# Patient Record
Sex: Female | Born: 1970 | Race: Black or African American | Hispanic: No | Smoking: Never smoker
Health system: Southern US, Community
[De-identification: ages and names within clinical notes are randomized; demographics above are authoritative.]

## PROBLEM LIST (undated history)

## (undated) DIAGNOSIS — L309 Dermatitis, unspecified: Secondary | ICD-10-CM

## (undated) DIAGNOSIS — G43909 Migraine, unspecified, not intractable, without status migrainosus: Secondary | ICD-10-CM

## (undated) DIAGNOSIS — M722 Plantar fascial fibromatosis: Secondary | ICD-10-CM

## (undated) HISTORY — DX: Dermatitis, unspecified: L30.9

## (undated) HISTORY — DX: Plantar fascial fibromatosis: M72.2

## (undated) HISTORY — DX: Migraine, unspecified, not intractable, without status migrainosus: G43.909

## (undated) HISTORY — PX: UVULECTOMY: SHX2631

---

## 2003-11-22 ENCOUNTER — Emergency Department (HOSPITAL_COMMUNITY): Admission: AD | Admit: 2003-11-22 | Discharge: 2003-11-22 | Payer: Self-pay | Admitting: Internal Medicine

## 2003-12-04 ENCOUNTER — Encounter: Admission: RE | Admit: 2003-12-04 | Discharge: 2003-12-04 | Payer: Self-pay | Admitting: Family Medicine

## 2004-01-27 ENCOUNTER — Emergency Department (HOSPITAL_COMMUNITY): Admission: EM | Admit: 2004-01-27 | Discharge: 2004-01-27 | Payer: Self-pay | Admitting: Emergency Medicine

## 2004-05-06 ENCOUNTER — Emergency Department (HOSPITAL_COMMUNITY): Admission: EM | Admit: 2004-05-06 | Discharge: 2004-05-06 | Payer: Self-pay | Admitting: Emergency Medicine

## 2006-09-11 ENCOUNTER — Ambulatory Visit: Payer: Self-pay | Admitting: Sports Medicine

## 2006-09-11 ENCOUNTER — Other Ambulatory Visit: Admission: RE | Admit: 2006-09-11 | Discharge: 2006-09-11 | Payer: Self-pay | Admitting: Family Medicine

## 2006-09-14 ENCOUNTER — Ambulatory Visit (HOSPITAL_COMMUNITY): Admission: RE | Admit: 2006-09-14 | Discharge: 2006-09-14 | Payer: Self-pay | Admitting: Family Medicine

## 2006-10-16 ENCOUNTER — Ambulatory Visit (HOSPITAL_COMMUNITY): Admission: RE | Admit: 2006-10-16 | Discharge: 2006-10-16 | Payer: Self-pay | Admitting: Family Medicine

## 2006-10-31 ENCOUNTER — Ambulatory Visit: Payer: Self-pay | Admitting: Family Medicine

## 2006-12-01 ENCOUNTER — Ambulatory Visit: Payer: Self-pay | Admitting: Sports Medicine

## 2006-12-07 ENCOUNTER — Ambulatory Visit: Payer: Self-pay | Admitting: Sports Medicine

## 2007-01-02 ENCOUNTER — Ambulatory Visit: Payer: Self-pay | Admitting: Family Medicine

## 2007-01-18 ENCOUNTER — Ambulatory Visit: Payer: Self-pay | Admitting: Family Medicine

## 2007-01-23 ENCOUNTER — Ambulatory Visit: Payer: Self-pay | Admitting: Family Medicine

## 2007-02-12 ENCOUNTER — Ambulatory Visit (HOSPITAL_COMMUNITY): Admission: RE | Admit: 2007-02-12 | Discharge: 2007-02-12 | Payer: Self-pay | Admitting: Family Medicine

## 2007-02-12 ENCOUNTER — Ambulatory Visit: Payer: Self-pay | Admitting: Family Medicine

## 2007-02-22 ENCOUNTER — Ambulatory Visit: Payer: Self-pay | Admitting: Family Medicine

## 2007-02-22 ENCOUNTER — Encounter: Payer: Self-pay | Admitting: Family Medicine

## 2007-02-26 ENCOUNTER — Ambulatory Visit: Payer: Self-pay | Admitting: Sports Medicine

## 2007-03-06 ENCOUNTER — Ambulatory Visit: Payer: Self-pay | Admitting: Family Medicine

## 2007-03-14 ENCOUNTER — Ambulatory Visit: Payer: Self-pay | Admitting: Sports Medicine

## 2007-03-15 ENCOUNTER — Ambulatory Visit (HOSPITAL_COMMUNITY): Admission: RE | Admit: 2007-03-15 | Discharge: 2007-03-15 | Payer: Self-pay | Admitting: Family Medicine

## 2007-03-15 ENCOUNTER — Encounter: Payer: Self-pay | Admitting: Family Medicine

## 2007-03-15 ENCOUNTER — Ambulatory Visit: Payer: Self-pay | Admitting: Obstetrics and Gynecology

## 2007-03-19 ENCOUNTER — Ambulatory Visit: Payer: Self-pay | Admitting: Obstetrics & Gynecology

## 2007-03-21 ENCOUNTER — Inpatient Hospital Stay (HOSPITAL_COMMUNITY): Admission: AD | Admit: 2007-03-21 | Discharge: 2007-03-23 | Payer: Self-pay | Admitting: Obstetrics & Gynecology

## 2007-03-21 ENCOUNTER — Ambulatory Visit: Payer: Self-pay | Admitting: Obstetrics & Gynecology

## 2007-06-13 ENCOUNTER — Ambulatory Visit: Payer: Self-pay | Admitting: Family Medicine

## 2007-06-13 ENCOUNTER — Telehealth (INDEPENDENT_AMBULATORY_CARE_PROVIDER_SITE_OTHER): Payer: Self-pay | Admitting: *Deleted

## 2007-10-24 ENCOUNTER — Encounter: Payer: Self-pay | Admitting: Family Medicine

## 2007-10-26 ENCOUNTER — Encounter: Admission: RE | Admit: 2007-10-26 | Discharge: 2007-10-26 | Payer: Self-pay | Admitting: Sports Medicine

## 2007-10-29 ENCOUNTER — Encounter: Payer: Self-pay | Admitting: Family Medicine

## 2008-04-01 ENCOUNTER — Emergency Department (HOSPITAL_COMMUNITY): Admission: EM | Admit: 2008-04-01 | Discharge: 2008-04-02 | Payer: Self-pay | Admitting: Emergency Medicine

## 2008-07-30 ENCOUNTER — Ambulatory Visit: Payer: Self-pay | Admitting: Family Medicine

## 2008-11-04 ENCOUNTER — Telehealth (INDEPENDENT_AMBULATORY_CARE_PROVIDER_SITE_OTHER): Payer: Self-pay | Admitting: *Deleted

## 2008-11-05 ENCOUNTER — Ambulatory Visit: Payer: Self-pay | Admitting: Family Medicine

## 2008-11-05 DIAGNOSIS — N912 Amenorrhea, unspecified: Secondary | ICD-10-CM | POA: Insufficient documentation

## 2008-11-05 LAB — CONVERTED CEMR LAB: Beta hcg, urine, semiquantitative: POSITIVE

## 2009-02-11 ENCOUNTER — Ambulatory Visit: Payer: Self-pay | Admitting: Family Medicine

## 2009-02-11 ENCOUNTER — Other Ambulatory Visit: Admission: RE | Admit: 2009-02-11 | Discharge: 2009-02-11 | Payer: Self-pay | Admitting: Family Medicine

## 2009-02-11 ENCOUNTER — Encounter: Payer: Self-pay | Admitting: Family Medicine

## 2009-02-11 DIAGNOSIS — R21 Rash and other nonspecific skin eruption: Secondary | ICD-10-CM | POA: Insufficient documentation

## 2009-02-11 LAB — CONVERTED CEMR LAB
Beta hcg, urine, semiquantitative: NEGATIVE
HDL: 51 mg/dL (ref >39)
LDL Cholesterol: 49 mg/dL (ref 0–99)
Total CHOL/HDL Ratio: 2.3
Triglycerides: 80 mg/dL (ref <150)

## 2009-02-12 ENCOUNTER — Encounter: Payer: Self-pay | Admitting: Family Medicine

## 2009-02-17 ENCOUNTER — Encounter: Payer: Self-pay | Admitting: Family Medicine

## 2009-06-29 ENCOUNTER — Encounter: Payer: Self-pay | Admitting: *Deleted

## 2009-06-29 ENCOUNTER — Ambulatory Visit: Payer: Self-pay | Admitting: Family Medicine

## 2009-06-29 DIAGNOSIS — L719 Rosacea, unspecified: Secondary | ICD-10-CM | POA: Insufficient documentation

## 2010-11-05 ENCOUNTER — Ambulatory Visit: Payer: Self-pay | Admitting: Family Medicine

## 2010-11-05 ENCOUNTER — Encounter: Payer: Self-pay | Admitting: *Deleted

## 2010-11-05 DIAGNOSIS — T7411XA Adult physical abuse, confirmed, initial encounter: Secondary | ICD-10-CM | POA: Insufficient documentation

## 2010-11-05 DIAGNOSIS — T148XXA Other injury of unspecified body region, initial encounter: Secondary | ICD-10-CM | POA: Insufficient documentation

## 2010-11-24 ENCOUNTER — Ambulatory Visit: Payer: Self-pay
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
## 2011-02-09 ENCOUNTER — Encounter: Payer: Self-pay | Admitting: *Deleted

## 2011-02-20 ENCOUNTER — Encounter: Payer: Self-pay | Admitting: *Deleted

## 2011-03-29 ENCOUNTER — Encounter: Payer: Self-pay | Admitting: Family Medicine

## 2011-03-29 ENCOUNTER — Ambulatory Visit (INDEPENDENT_AMBULATORY_CARE_PROVIDER_SITE_OTHER): Payer: Self-pay | Admitting: Family Medicine

## 2011-03-29 DIAGNOSIS — L299 Pruritus, unspecified: Secondary | ICD-10-CM

## 2011-03-29 DIAGNOSIS — J029 Acute pharyngitis, unspecified: Secondary | ICD-10-CM

## 2011-03-29 MED ORDER — CETIRIZINE HCL 10 MG PO TABS: 10.0000 mg | ORAL_TABLET | Freq: Every day | ORAL | Status: DC

## 2011-03-29 MED ORDER — OMEPRAZOLE 20 MG PO CPDR: 20.0000 mg | DELAYED_RELEASE_CAPSULE | Freq: Every day | ORAL | Status: DC

## 2011-03-29 NOTE — Assessment & Plan Note (Signed)
Chronic, appears to be triggered by heat.  Will start zyrtec.

## 2011-03-29 NOTE — Patient Instructions (Signed)
Take a newer allergy medication that does not make your sleepy.  I have sent in a prescription for generic zyrtec which will help with itching.  You can also buy this over the counter. Try omeprazole for stomach acid for several weeks. Make follow-up to meet your new doctor.  Acid Reflux (GERD) Acid reflux is also called gastroesophageal reflux disease (GERD). Your stomach makes acid to help digest food. Acid reflux happens when acid from your stomach goes into the tube between your mouth and stomach (esophagus). Your stomach is protected from the acid, but this tube is not. When acid gets into the tube, it may cause a burning feeling in the chest (heartburn). Besides heartburn, other health problems can happen if the acid keeps going into the tube. Some causes of acid reflux include:  Being overweight.   Smoking.   Drinking alcohol.   Eating large meals.   Eating meals and then going to bed right away.   Eating certain foods.   Increased stomach acid production.  HOME CARE  Take all medicine as told by your doctor.   You may need to:   Lose weight.   Avoid alcohol.   Quit smoking.   Do not eat big meals. It is better to eat smaller meals throughout the day.   Do not eat a meal and then nap or go to bed.   Sleep with your head higher than your stomach.   Avoid foods that bother you.   You may need more tests, or you may need to see a special doctor.  GET HELP RIGHT AWAY IF:  You have chest pain that is different than before.   You have pain that goes to your arms, jaw, or between your shoulder blades.   You throw up (vomit) blood, dark brown liquid, or your throw up looks like coffee grounds.   You have trouble swallowing.   You have trouble breathing or cannot stop coughing.   You feel dizzy or pass out.   Your skin is cool, wet, and pale.   Your medicine is not helping.  MAKE SURE YOU:    Understand these instructions.   Will watch your condition.    Will get help right away if you are not doing well or get worse.  Document Released: 05/23/2008 Document Re-Released: 03/01/2010 [facility] Patient Information 2011 [place], [place].

## 2011-03-29 NOTE — Progress Notes (Signed)
  Subjective:    Patient ID: Rebecca Blevins, female    DOB: 1971/05/29, 40 y.o.   MRN: [id number]  HPI work in appt for "sore throat" x 3 weeks and rash all over for years.  sore throat:  Had uri, with slowed resolution of sore throat and cough.  Describes sensation of throat closing with throat pain.  Occurs mostly at night and connects it with laying flat.  Notes a feeling of unable to breath when she puts the covers over her head at night.    No food triggers, epigastric burning, nocturnal cough. Denies sneezing, rhinorrhea, itchy eyes.  Rash all over:  Has what she describes as :circles all over when I scratch"  States was previously given cream for this.  No exposure to new chemicals    Review of Systemssee hpi    Objective:   Physical Exam  Constitutional: She appears well-developed and well-nourished. No distress.  HENT:  Mouth/Throat: Oropharynx is clear and moist. No oropharyngeal exudate.       Absent uvula  Eyes: Conjunctivae are normal. Pupils are equal, round, and reactive to light.  Neck: Neck supple. No thyromegaly present.  Cardiovascular: Normal rate and regular rhythm.   No murmur heard. Pulmonary/Chest: Effort normal and breath sounds normal. No respiratory distress. She has no wheezes. She has no rales.  Abdominal: Soft. Bowel sounds are normal.  Lymphadenopathy:    She has no cervical adenopathy.  Skin: Skin is warm.       Dry skin, hyperpigmentaion over forearms, legs, no erythema or hives          Assessment & Plan:

## 2011-03-29 NOTE — Assessment & Plan Note (Signed)
History of somewhat unclear.  Most likely seems to be due to silent reflux when supine causing laryngospasm and pain which attributes her hoarseness and sore throat and feeling of being tight-chested.  May also be related to post-nasal drainage for which she is being started on zyrtec for hives.  Will start omeprazole, and asked her to make follow-up in 2-3 weeks with PCP for recheck and establish primary care.

## 2011-04-14 ENCOUNTER — Encounter: Payer: Self-pay | Admitting: Family Medicine

## 2011-04-14 ENCOUNTER — Ambulatory Visit (INDEPENDENT_AMBULATORY_CARE_PROVIDER_SITE_OTHER): Payer: Medicaid Other | Admitting: Family Medicine

## 2011-04-14 DIAGNOSIS — L502 Urticaria due to cold and heat: Secondary | ICD-10-CM

## 2011-04-14 DIAGNOSIS — J029 Acute pharyngitis, unspecified: Secondary | ICD-10-CM

## 2011-04-14 MED ORDER — CETIRIZINE HCL 10 MG PO TABS: 10.0000 mg | ORAL_TABLET | Freq: Every day | ORAL | Status: DC

## 2011-04-14 MED ORDER — OMEPRAZOLE 20 MG PO CPDR: 20.0000 mg | DELAYED_RELEASE_CAPSULE | Freq: Every day | ORAL | Status: DC

## 2011-04-14 NOTE — Assessment & Plan Note (Signed)
Most likely caused by reflux.  Will continue omeprazole since symptoms have dramatically improved with therapy.

## 2011-04-14 NOTE — Assessment & Plan Note (Signed)
Will continue zyrtec since giving pt good relief.  Cause of the urticaria still unclear.

## 2011-04-14 NOTE — Progress Notes (Signed)
  Subjective:    Patient ID: Rebecca Blevins, female    DOB: May 19, 1971, 40 y.o.   MRN: [id number]  HPI Rash- Rash first appeared in 2008- usually will have rash from March until July.  States that creams do not usually help.  Was started on zytec po at last appt for this issue by Dr. Earnest Bailey.  Pt states that this has helped rash.  No hyperpigmented areas area present where rash started.  Pt endorses good symptom relief with zyrtec and requests refill.  Pt states that she is not sure what she is in contact with that causes her to have this rash each year.  Reflux- Pt reported to dr. Earnest Bailey at last appt symptoms that seemed consistent with reflux.  Pt states that  The omeprazole has helped the tightness in her throat, sore throat, and the epigastric discomfort that she has had in the past.  She is requesting refill of omeprazole.    Review of Systems    as per above Objective:   Physical Exam  Constitutional: She appears well-developed and well-nourished.  Cardiovascular: Regular rhythm.   No murmur heard. Pulmonary/Chest: Effort normal and breath sounds normal. No respiratory distress. She has no wheezes.  Abdominal: Soft. She exhibits no distension. There is no tenderness.  Skin: Skin is warm. No rash noted.       + hyperpigmented round macules scattered on lower legs bilateral.  Large round hyperpigmented macule on left chest.            Assessment & Plan:

## 2011-04-14 NOTE — Patient Instructions (Signed)
Since you have had good results with the zyrtec and the omeprazole I think that it is a good idea to continue these for now.  Please return as needed if you have any new or worsening of your symptoms.

## 2011-06-12 ENCOUNTER — Emergency Department (HOSPITAL_COMMUNITY)
Admission: EM | Admit: 2011-06-12 | Discharge: 2011-06-12 | Disposition: A | Discharge status: Home / Self Care | Payer: Medicaid Other | Attending: Emergency Medicine | Admitting: Emergency Medicine

## 2011-06-12 ENCOUNTER — Emergency Department (HOSPITAL_COMMUNITY): Payer: Medicaid Other

## 2011-06-12 DIAGNOSIS — M62838 Other muscle spasm: Secondary | ICD-10-CM | POA: Insufficient documentation

## 2011-06-12 DIAGNOSIS — M542 Cervicalgia: Secondary | ICD-10-CM | POA: Insufficient documentation

## 2011-06-12 DIAGNOSIS — M79609 Pain in unspecified limb: Secondary | ICD-10-CM | POA: Insufficient documentation

## 2011-06-12 DIAGNOSIS — M7989 Other specified soft tissue disorders: Secondary | ICD-10-CM | POA: Insufficient documentation

## 2011-07-14 ENCOUNTER — Other Ambulatory Visit (HOSPITAL_COMMUNITY)
Admission: RE | Admit: 2011-07-14 | Discharge: 2011-07-14 | Disposition: A | Discharge status: Home / Self Care | Payer: Medicaid Other | Source: Ambulatory Visit | Attending: Family Medicine | Admitting: Family Medicine

## 2011-07-14 ENCOUNTER — Ambulatory Visit (INDEPENDENT_AMBULATORY_CARE_PROVIDER_SITE_OTHER): Payer: Medicaid Other | Admitting: Family Medicine

## 2011-07-14 ENCOUNTER — Encounter: Payer: Self-pay | Admitting: Family Medicine

## 2011-07-14 DIAGNOSIS — Z719 Counseling, unspecified: Secondary | ICD-10-CM

## 2011-07-14 DIAGNOSIS — Z124 Encounter for screening for malignant neoplasm of cervix: Secondary | ICD-10-CM

## 2011-07-14 DIAGNOSIS — L502 Urticaria due to cold and heat: Secondary | ICD-10-CM

## 2011-07-14 DIAGNOSIS — N760 Acute vaginitis: Secondary | ICD-10-CM

## 2011-07-14 DIAGNOSIS — Z7189 Other specified counseling: Secondary | ICD-10-CM

## 2011-07-14 DIAGNOSIS — Z01419 Encounter for gynecological examination (general) (routine) without abnormal findings: Secondary | ICD-10-CM | POA: Insufficient documentation

## 2011-07-14 MED ORDER — EUCERIN EX CREA: TOPICAL_CREAM | Freq: Every day | CUTANEOUS | Status: DC

## 2011-07-14 MED ORDER — FEXOFENADINE HCL 180 MG PO TABS: 180.0000 mg | ORAL_TABLET | ORAL | Status: DC

## 2011-07-14 MED ORDER — CETIRIZINE HCL 10 MG PO TABS: 10.0000 mg | ORAL_TABLET | Freq: Every day | ORAL | Status: DC

## 2011-07-14 NOTE — Progress Notes (Signed)
  Subjective:    Patient ID: Rebecca Blevins, female    DOB: 23-Sep-1971, 40 y.o.   MRN: [id number]  HPI Itching/rash -In summertime, has had this every summer for the past 3 years,  itching and little bumps, all over body, zyrtec makes it better but wants the rash to go away without use of medicine. On scalp and all over your body.  No fever.  Skin feels hot and sometimes has burning sensation  Pap- No history of abnormal pap.  Has one partner.  States she is not sure if husband is faithful.  Would like std screening  Health maintenance- Up to date on vaccines.  Pt states that she has not noticed any lumps or bumps on breasts.  No nipple discharge.    Used arabic phone interpreter to conduct interview.      Review of Systems     Objective:   Physical Exam  Constitutional: She is oriented to person, place, and time. She appears well-developed and well-nourished.  HENT:  Head: Normocephalic and atraumatic.  Cardiovascular: Normal rate and regular rhythm.   No murmur heard. Pulmonary/Chest: Effort normal. No respiratory distress. She has no wheezes.  Abdominal: Soft. She exhibits no distension. There is no tenderness. There is no rebound and no guarding.  Musculoskeletal: Normal range of motion. She exhibits no edema.  Neurological: She is alert and oriented to person, place, and time.  Skin:       Dry skin on back, abd, and extremities.  + excoriations and some small scabs on abdomen.  No rash.  Scalp wnl.  No dryness noted.  On small scab noted on left side of scalp.  No rash.   Psychiatric: She has a normal mood and affect.          Assessment & Plan:

## 2011-07-14 NOTE — Patient Instructions (Signed)
Continue to use zyrtec for your heat rash.  Return as needed if zyrtec no longer working and you would like to try a different medication regimen I will send you your results of the pap smear in the mail.   Return in 1 year for your annual physical.

## 2011-07-15 ENCOUNTER — Encounter: Payer: Self-pay | Admitting: Family Medicine

## 2011-07-16 DIAGNOSIS — Z719 Counseling, unspecified: Secondary | ICD-10-CM | POA: Insufficient documentation

## 2011-07-16 DIAGNOSIS — Z0000 Encounter for general adult medical examination without abnormal findings: Secondary | ICD-10-CM | POA: Insufficient documentation

## 2011-07-16 NOTE — Assessment & Plan Note (Signed)
Up to date on vaccines.  STD screening pending for pt. Pap smear obtained and now pending.

## 2011-07-16 NOTE — Assessment & Plan Note (Signed)
Zyrtec helped but not lasting long enough.  Will have pt take allegra in am and zyrtec in pm as recommended in uptodate for chronic uticaria.  Pt is to also use eucerin cream and not scratch or use objects to scratch skin with   Pt to return if no improvement.

## 2011-09-13 LAB — URINALYSIS, ROUTINE W REFLEX MICROSCOPIC
Bilirubin Urine: NEGATIVE
Glucose, UA: NEGATIVE
Ketones, ur: NEGATIVE
Leukocytes, UA: NEGATIVE
Nitrite: NEGATIVE
Protein, ur: NEGATIVE
Specific Gravity, Urine: 1.02
Urobilinogen, UA: 0.2
pH: 5

## 2011-09-13 LAB — COMPREHENSIVE METABOLIC PANEL
Albumin: 4.2
BUN: 9
Calcium: 9.1
Chloride: 104
Creatinine, Ser: 0.58
Total Bilirubin: 0.5
Total Protein: 7.4

## 2011-09-13 LAB — CBC
HCT: 36.3
Hemoglobin: 12.2
MCHC: 33.5
MCV: 86.4
Platelets: 340
RBC: 4.21
RDW: 13.4
WBC: 9.1

## 2011-09-13 LAB — DIFFERENTIAL
Basophils Absolute: 0.1
Basophils Relative: 1
Eosinophils Absolute: 0.2
Eosinophils Relative: 3
Lymphocytes Relative: 13
Lymphs Abs: 1.2
Monocytes Absolute: 0.5
Monocytes Relative: 6
Neutro Abs: 7
Neutrophils Relative %: 77

## 2011-09-13 LAB — COMPREHENSIVE METABOLIC PANEL WITH GFR
ALT: 10
AST: 14
Alkaline Phosphatase: 77
CO2: 23
GFR calc Af Amer: >60
GFR calc non Af Amer: >60
Glucose, Bld: 104 — ABNORMAL HIGH
Potassium: 3.5
Sodium: 138

## 2011-09-13 LAB — URINE MICROSCOPIC-ADD ON

## 2011-09-13 LAB — LIPASE, BLOOD: Lipase: 25

## 2011-09-13 LAB — PREGNANCY, URINE: Preg Test, Ur: NEGATIVE

## 2011-12-27 ENCOUNTER — Encounter (HOSPITAL_COMMUNITY): Payer: Self-pay | Admitting: Emergency Medicine

## 2011-12-27 ENCOUNTER — Emergency Department (HOSPITAL_COMMUNITY)
Admission: EM | Admit: 2011-12-27 | Discharge: 2011-12-28 | Disposition: A | Discharge status: Home / Self Care | Payer: Medicaid Other | Attending: Emergency Medicine | Admitting: Emergency Medicine

## 2011-12-27 DIAGNOSIS — R51 Headache: Secondary | ICD-10-CM | POA: Insufficient documentation

## 2011-12-27 NOTE — ED Notes (Signed)
PT. REPORTS HEADACHE WITH GENERALIZED BODY AND MUSCLE ACHES FOR SEVERAL DAYS , SLIGHT NAUSEA , NO VOMITTING OR DIARRHEA.

## 2011-12-28 ENCOUNTER — Emergency Department (HOSPITAL_COMMUNITY): Payer: Medicaid Other

## 2011-12-28 MED ORDER — METOCLOPRAMIDE HCL 5 MG/ML IJ SOLN
10.0000 mg | Freq: Once | INTRAMUSCULAR | Status: DC
  Filled 2011-12-28: qty 2

## 2011-12-28 MED ORDER — OXYCODONE-ACETAMINOPHEN 5-325 MG PO TABS
ORAL_TABLET | ORAL | Status: AC
  Filled 2011-12-28: qty 1

## 2011-12-28 NOTE — ED Notes (Signed)
Please see paper documentation

## 2011-12-28 NOTE — ED Provider Notes (Signed)
History     CSN: [id number]  Arrival date & time 12/27/11  2025   First MD Initiated Contact with Patient 12/27/11 2257      Chief Complaint  Patient presents with  . Headache    (Consider location/radiation/quality/duration/timing/severity/associated sxs/prior treatment) HPI History provided by pt.   Pt presents w/ c/o headache since this morning.  Diffuse initially and now isolated to left frontal region.  Associated w/ color-blindness that started early this afternoon and resolved after waking from nap in ED waiting room.  Denies fever, other vision changes, dizziness, vomiting, photo/phonophobia, extremity weakness/paresthesias.  Has had headaches in the past but this one feels different.  No recent head trauma.    History reviewed. No pertinent past medical history.  Past Surgical History  Procedure Date  . Uvulectomy     as a child in [place]    No family history on file.  History  Substance Use Topics  . Smoking status: Never Smoker   . Smokeless tobacco: Not on file  . Alcohol Use: No    OB History    Grav Para Term Preterm Abortions TAB SAB Ect Mult Living                  Review of Systems  All other systems reviewed and are negative.    Allergies  Review of patient's allergies indicates no known allergies.  Home Medications  No current outpatient prescriptions on file.  BP 121/71  Pulse 69  Temp(Src) 97.9 F (36.6 C) (Oral)  Resp 16  SpO2 100%  Physical Exam  Nursing note and vitals reviewed. Constitutional: She is oriented to person, place, and time. She appears well-developed and well-nourished.  HENT:  Head: Normocephalic and atraumatic.  Eyes:       Normal appearance  Neck: Normal range of motion. Neck supple. No rigidity. No Brudzinski's sign and no Kernig's sign noted.  Cardiovascular: Normal rate and regular rhythm.   Pulmonary/Chest: Effort normal and breath sounds normal.  Neurological: She is alert and oriented to person, place,  and time. No cranial nerve deficit or sensory deficit. Coordination normal.       5/5 and equal upper and lower extremity strength.  No past pointing.    Skin: Skin is warm and dry. No rash noted.  Psychiatric: She has a normal mood and affect. Her behavior is normal.    ED Course  Procedures (including critical care time)  Labs Reviewed - No data to display Ct Head Wo Contrast  12/28/2011  *RADIOLOGY REPORT*  Clinical Data: Headache.  CT HEAD WITHOUT CONTRAST  Technique:  Contiguous axial images were obtained from the base of the skull through the vertex without contrast.  Comparison: None.  Findings: No mass lesion, mass effect, midline shift, hydrocephalus, hemorrhage.  No territorial ischemia or acute infarction.  Benign basal ganglia calcifications present on the right.  Paranasal sinuses and mastoid air cells appear clear.  IMPRESSION: Negative CT head.  Original Report Authenticated By: Andreas Newport, M.D.     1. Headache       MDM  Pt presents w/ c/o atraumatic headache.  Afebrile and no meningeal signs or focal neuro deficits on exam.  CT head ordered d/t mild communication barrier, new onset and c/o associated, transient color-blindness.  Neg for acute pathology.  Results discussed w/ pt.  She has had relief of pain w/ IM reglan and appears more comfortable.  Referred back to PCP and return precautions discussed.  Arie Sabina [place], [place] 12/28/11 2028

## 2011-12-29 NOTE — ED Provider Notes (Signed)
Medical screening examination/treatment/procedure(s) were performed by non-physician practitioner and as supervising physician I was immediately available for consultation/collaboration.   Lyanne Co, MD 12/29/11 [phone]

## 2013-04-08 ENCOUNTER — Encounter (HOSPITAL_COMMUNITY): Payer: Self-pay | Admitting: Emergency Medicine

## 2013-04-08 DIAGNOSIS — M62838 Other muscle spasm: Secondary | ICD-10-CM | POA: Insufficient documentation

## 2013-04-08 DIAGNOSIS — Y9389 Activity, other specified: Secondary | ICD-10-CM | POA: Insufficient documentation

## 2013-04-08 DIAGNOSIS — M5380 Other specified dorsopathies, site unspecified: Secondary | ICD-10-CM | POA: Insufficient documentation

## 2013-04-08 DIAGNOSIS — Y92410 Unspecified street and highway as the place of occurrence of the external cause: Secondary | ICD-10-CM | POA: Insufficient documentation
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
## 2013-04-09 ENCOUNTER — Emergency Department (HOSPITAL_COMMUNITY)
Admission: EM | Admit: 2013-04-09 | Discharge: 2013-04-09 | Disposition: A | Discharge status: Home / Self Care | Payer: No Typology Code available for payment source | Attending: Emergency Medicine | Admitting: Emergency Medicine

## 2013-04-09 DIAGNOSIS — M62838 Other muscle spasm: Secondary | ICD-10-CM

## 2013-04-09 DIAGNOSIS — M62830 Muscle spasm of back: Secondary | ICD-10-CM

## 2013-04-09 MED ORDER — IBUPROFEN 800 MG PO TABS: 800.0000 mg | ORAL_TABLET | Freq: Four times a day (QID) | ORAL | Status: DC | PRN

## 2013-04-09 MED ORDER — DIAZEPAM 5 MG PO TABS: 5.0000 mg | ORAL_TABLET | Freq: Three times a day (TID) | ORAL | Status: DC | PRN

## 2013-04-09 MED ORDER — HYDROCODONE-ACETAMINOPHEN 5-325 MG PO TABS: 2.0000 | ORAL_TABLET | ORAL | Status: DC | PRN

## 2013-04-09 MED ORDER — IBUPROFEN 800 MG PO TABS
800.0000 mg | ORAL_TABLET | Freq: Once | ORAL | Status: AC
  Administered 2013-04-09: 800 mg via ORAL
  Filled 2013-04-09: qty 1
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
## 2013-04-09 NOTE — ED Notes (Signed)
Pt denies any questions upon discharge. 

## 2014-01-21 ENCOUNTER — Telehealth: Payer: Self-pay | Admitting: *Deleted

## 2014-01-21 NOTE — Telephone Encounter (Signed)
Pt walked in clinic complaining of headache x 1 day. Pt stated she took some Ibuprofen this AM, but went to work.  Pt sent to Urgent Care,  [facility].  Clovis PuMartin, Willadene Mounsey L, RN

## 2014-01-31 ENCOUNTER — Ambulatory Visit (INDEPENDENT_AMBULATORY_CARE_PROVIDER_SITE_OTHER): Payer: Self-pay | Admitting: Family Medicine

## 2014-01-31 ENCOUNTER — Encounter: Payer: Self-pay | Admitting: Family Medicine

## 2014-01-31 VITALS — BP 129/85 | HR 69 | Temp 98.1°F | Ht 66.0 in | Wt 166.0 lb

## 2014-01-31 DIAGNOSIS — M542 Cervicalgia: Secondary | ICD-10-CM | POA: Insufficient documentation

## 2014-01-31 MED ORDER — CYCLOBENZAPRINE HCL 10 MG PO TABS: 10.0000 mg | ORAL_TABLET | Freq: Every day | ORAL | Status: DC

## 2014-01-31 NOTE — Patient Instructions (Signed)
It was a pleasure to see you today. I believe your neck/head pain is related to muscle spasm.   You were given Toradol injection today in the office.  Please do NOT take more ibuprofen today.   I sent a prescription for CYCLOBENZAPRINE 10mg  one [facility] at [facility]time[facility] [address]   Please make a follow up visit with your doctor in 2 to 3 weeks, or sooner if the pain is not improving or if it gets worse.

## 2014-01-31 NOTE — Progress Notes (Signed)
   Subjective:    Patient ID: Rebecca BreedingSakina M Rhymes, female    DOB: 08/19/1971, 43 y.o.   MRN: [id number]  HPI Patient seen today for SDA for complaint of nuchal headache for the past 10 days.  She noticed it while in bed at 1am.  Has been constant, starting in the cervical muscles (pointing with hands) and radiating up to base of skull.  No associated nausea; does have some light sensitivity from time to time.  No lateralization. No fevers/chills.  Has had a big headache once before in 2010, was seen in hospital, unclear diagnosis.  Since onset of this HA 10 days ago, regular ibuprofen 400mg  twice a day which brings the pain down to 3-4/10 where it is now.    Reviewed med list.  Not taking anything other than ibuprofen prn.  No smoking/drugs/alcohol use. Works in Stage managerhousekeeping.   Review of Systems No fevers, no rhinorrhea or otorrhea; no cough. No head trauma.  No migraine history.     Objective:   Physical Exam  Well appearing, no apparent distress HEENT Neck supple, able to perform active ROM flexion/extension, rotation bilat.  TENDERNESS along bilateral cervical paraspinous mm with palpation.  No point tenderness over vertebral processes. No tenderness over scalp in occiput, frontal or maxillary sinuses.  FUNDUSCOPIC EXAM non-dilated, with crisp discs bilat and no evidence of papilledema. PERRL. EOMI.  Nasal mucosa intact, normal appearing.  Clear oropharynx. No exudates. TMs clear bilaterally. No cervical adenopathy.          Assessment & Plan:

## 2014-01-31 NOTE — Assessment & Plan Note (Signed)
Pain described by patient as headache but on exam is more compatible with diagnosis of cervical muscle strain (over paraspinous cervical mm rather than cranium).  Not a migraineur, not suggestive of CNS irritation/infection or sinus headache.  Consider tension HA although does not extend beyond the cervical region.  Muscle relaxants at bedtime, discussed possible side effects related to sedation.  I am told that Toradol is on back-order and not available to give to patient IM today.  May use ibuprofen for now, schedule follow up with primary physician in the coming 2 weeks if continues, seek attention sooner if worsens.

## 2014-02-19 ENCOUNTER — Encounter: Payer: Self-pay | Admitting: Family Medicine

## 2014-02-19 ENCOUNTER — Ambulatory Visit (INDEPENDENT_AMBULATORY_CARE_PROVIDER_SITE_OTHER): Payer: Medicaid Other | Admitting: Family Medicine

## 2014-02-19 VITALS — BP 133/79 | HR 87 | Temp 98.2°F | Ht 66.0 in | Wt 172.0 lb

## 2014-02-19 DIAGNOSIS — M542 Cervicalgia: Secondary | ICD-10-CM

## 2014-02-19 MED ORDER — CYCLOBENZAPRINE HCL 10 MG PO TABS: 10.0000 mg | ORAL_TABLET | Freq: Every day | ORAL | Status: DC

## 2014-02-19 NOTE — Progress Notes (Signed)
   Subjective:    Patient ID: Rebecca BreedingSakina M Blevins, female    DOB: 09/28/1971, 43 y.o.   MRN: [id number]  HPI  43 year old F with is following up for a evaluation of a headache. She was last see on 01/31/14 by Dr. Barbaraann BoysJ. Breen and diagnosed with cervical muscle strain with likely tension headache component. She was prescribed flexeril to use PRN and also ibuprofen 2 tabs every 6 hours, but not every day.    Today she notes that is is improved. It is now a 5/10. It is exacerbated in by walking and working. It is usually worse at around 1 PM. She is a house cleaner. She denies any recent trauma or falls. It is relieved by flexeril and ibuprofen. She is taking flexeril at night and ibuprofen 2 tabs q 6 hours as needed for pain. However, she does not do this each day.   Social - Pt from [place] and has family still there and son is in [place]   Review of Systems Positive - blurred vision, worsened by headache; difficulty with seeing small letters when reading papers; social stress  Negative - weakness in upper extremities, weakness of grip; nausea, vomiting      Objective:   Physical Exam BP 133/79  Pulse 87  Temp(Src) 98.2 F (36.8 C) (Oral)  Ht 5\' 6"  (1.676 m)  Wt 172 lb (78.019 kg)  BMI 27.77 kg/m2  LMP 03/19/2013  Gen: well appearing African female, pleasant and conversant  HEENT: NCAT, EOMI, neck with norma ROM, very tight cervical muscles at base of occiput with tenderness of paraspinal muscles and trapezius; no nuchal rigidity or meningismus ; negative spurlings sign  Upper Extremities: no atrophy, 5/5 strength, Neuro:  2+ DTR of biceps; sensation intact and equal bilaterally      Assessment & Plan:

## 2014-02-19 NOTE — Patient Instructions (Signed)
Mrs. Rebecca Blevins,   It was nice to meet you. Thank you for coming to see me. I am sorry about the pain in your neck. I think that you have very tight muscles in your neck and you should do the following things:   1. Use a heating pad on your neck  2. Rub a cream called Aspercreme into your neck 3 time per day. You can buy this at any pharmacy.   3. Continue with the muscle relaxer called flexeril at nighttime.   Come back for a physical in July or earlier if needed.   Sincerely,   Dr. Clinton SawyerWilliamson

## 2014-02-19 NOTE — Assessment & Plan Note (Signed)
A: pt with presentation consistent with continued cervicalgia, no evidence of radiculopathy, so conservative treatment still appropriate and no reason for imaging at this time  P:  - Cont flexeril 10 mg PO QHS PRN - Use aspercreme and heating pad PRN

## 2014-05-22 ENCOUNTER — Emergency Department (HOSPITAL_COMMUNITY)
Admission: EM | Admit: 2014-05-22 | Discharge: 2014-05-23 | Disposition: A | Discharge status: Home / Self Care | Payer: Medicaid Other | Attending: Emergency Medicine | Admitting: Emergency Medicine

## 2014-05-22 ENCOUNTER — Encounter (HOSPITAL_COMMUNITY): Payer: Self-pay | Admitting: Emergency Medicine

## 2014-05-22 DIAGNOSIS — H538 Other visual disturbances: Secondary | ICD-10-CM | POA: Insufficient documentation

## 2014-05-22 DIAGNOSIS — R110 Nausea: Secondary | ICD-10-CM | POA: Insufficient documentation

## 2014-05-22 DIAGNOSIS — H539 Unspecified visual disturbance: Secondary | ICD-10-CM

## 2014-05-22 DIAGNOSIS — R519 Headache, unspecified: Secondary | ICD-10-CM

## 2014-05-22 DIAGNOSIS — R51 Headache: Secondary | ICD-10-CM | POA: Insufficient documentation

## 2014-05-22 DIAGNOSIS — H5710 Ocular pain, unspecified eye: Secondary | ICD-10-CM | POA: Insufficient documentation

## 2014-05-22 MED ORDER — DEXAMETHASONE SODIUM PHOSPHATE 10 MG/ML IJ SOLN
10.0000 mg | Freq: Once | INTRAMUSCULAR | Status: AC
  Administered 2014-05-22: 10 mg via INTRAVENOUS
  Filled 2014-05-22: qty 1

## 2014-05-22 MED ORDER — DIPHENHYDRAMINE HCL 50 MG/ML IJ SOLN
25.0000 mg | Freq: Once | INTRAMUSCULAR | Status: AC
  Administered 2014-05-22: 25 mg via INTRAVENOUS
  Filled 2014-05-22: qty 1

## 2014-05-22 MED ORDER — METOCLOPRAMIDE HCL 5 MG/ML IJ SOLN
10.0000 mg | Freq: Once | INTRAMUSCULAR | Status: AC
  Administered 2014-05-22: 10 mg via INTRAVENOUS
  Filled 2014-05-22: qty 2

## 2014-05-22 MED ORDER — SODIUM CHLORIDE 0.9 % IV BOLUS (SEPSIS)
1000.0000 mL | Freq: Once | INTRAVENOUS | Status: AC
  Administered 2014-05-22: 1000 mL via INTRAVENOUS

## 2014-05-22 NOTE — Discharge Instructions (Signed)
Blurred Vision °You have been seen today complaining of blurred vision. This means you have a loss of ability to see small details.  °CAUSES  °Blurred vision can be a symptom of underlying eye problems, such as: °· Aging of the eye (presbyopia). °· Glaucoma. °· Cataracts. °· Eye infection. °· Eye-related migraine. °· Diabetes mellitus. °· Fatigue. °· Migraine headaches. °· High blood pressure. °· Breakdown of the back of the eye (macular degeneration). °· Problems caused by some medications. °The most common cause of blurred vision is the need for eyeglasses or a new prescription. Today in the emergency department, no cause for your blurred vision can be found. °SYMPTOMS  °Blurred vision is the loss of visual sharpness and detail (acuity). °DIAGNOSIS  °Should blurred vision continue, you should see your caregiver. If your caregiver is your primary care physician, he or she may choose to refer you to another specialist.  °TREATMENT  °Do not ignore your blurred vision. Make sure to have it checked out to see if further treatment or referral is necessary. °SEEK MEDICAL CARE IF:  °You are unable to get into a specialist so we can help you with a referral. °SEEK IMMEDIATE MEDICAL CARE IF: °You have severe eye pain, severe headache, or sudden loss of vision. °MAKE SURE YOU:  °· Understand these instructions. °· Will watch your condition. °· Will get help right away if you are not doing well or get worse. °Document Released: 12/08/2003 Document Revised: 02/27/2012 Document Reviewed: 07/09/2008 °ExitCare® Patient Information ©2014 [employer]. ° °

## 2014-05-22 NOTE — ED Provider Notes (Signed)
CSN: [id number]     Arrival date & time 05/22/14  1926 History   First MD Initiated Contact with Patient 05/22/14 2102     Chief Complaint  Patient presents with  . Blurred Vision     (Consider location/radiation/quality/duration/timing/severity/associated sxs/prior Treatment) Patient is a 43 y.o. female presenting with general illness. The history is provided by the patient and a relative.  Illness Severity:  Moderate Onset quality:  Sudden Timing:  Intermittent Progression:  Partially resolved Chronicity:  New Associated symptoms: headaches and nausea   Associated symptoms: no abdominal pain, no chest pain, no congestion, no cough, no diarrhea, no fatigue, no fever, no loss of consciousness, no rash, no rhinorrhea, no shortness of breath and no vomiting      43 yo F pw blurred vision. Feeling well earlier today. While at school this evening, patient developed blurry vision. B/l eyes. Minimal nausea. No emesis. No headache initially.  No fevers.  No recent head trauma.  Reports similar symptoms couple years ago, but at that time with severe headache. Today without headache initially, but now with mild headache over right forehead/temple.  Visual changes resolved PTA. No dizziness, LH, or vertigo.  No new meds.  No other complaints.  History reviewed. No pertinent past medical history. Past Surgical History  Procedure Laterality Date  . [place]vulectomy      as a child in [place]   No family history on file. History  Substance Use Topics  . Smoking status: Never Smoker   . Smokeless tobacco: Not on file  . Alcohol Use: No   OB History   Grav Para Term Preterm Abortions TAB SAB Ect Mult Living                 Review of Systems  Constitutional: Negative for fever, chills and fatigue.  HENT: Negative for congestion and rhinorrhea.   Eyes: Positive for pain (initially denies. later endorsed very minimal discomfort around/to right eye) and visual disturbance. Negative for  photophobia and redness.  Respiratory: Negative for cough and shortness of breath.   Cardiovascular: Negative for chest pain and leg swelling.  Gastrointestinal: Positive for nausea. Negative for vomiting, abdominal pain and diarrhea.  Genitourinary: Negative for flank pain and difficulty urinating.  Musculoskeletal: Negative for back pain.  Skin: Negative for color change and rash.  Neurological: Positive for headaches. Negative for dizziness, loss of consciousness, weakness, light-headedness and numbness.  All other systems reviewed and are negative.     Allergies  Review of patient's allergies indicates no known allergies.  Home Medications   Prior to Admission medications   Medication Sig Start Date End Date Taking? Authorizing Provider  cyclobenzaprine (FLEXERIL) 10 MG tablet Take 10 mg by mouth at bedtime as needed for muscle spasms.   Yes Historical Provider, MD   BP 135/73  Pulse 66  Temp(Src) 97.5 F (36.4 C) (Oral)  Resp 14  Ht 5\' 6"  (1.676 m)  Wt 173 lb (78.472 kg)  BMI 27.94 kg/m2  SpO2 100% Physical Exam  Nursing note and vitals reviewed. Constitutional: She is oriented to person, place, and time. She appears well-developed and well-nourished. No distress.  HENT:  Head: Normocephalic and atraumatic.  Eyes: Conjunctivae and EOM are normal. Pupils are equal, round, and reactive to light. Right eye exhibits no discharge and no hordeolum. Left eye exhibits no discharge and no hordeolum. Right conjunctiva has no hemorrhage. Left conjunctiva has no hemorrhage.  Neck: Normal range of motion. Neck supple. No tracheal deviation present.  Cardiovascular: Normal rate, regular rhythm, normal heart sounds and intact distal pulses.   Pulmonary/Chest: Effort normal and breath sounds normal. No stridor. No respiratory distress. She has no wheezes. She has no rales.  Abdominal: Soft. She exhibits no distension. There is no tenderness. There is no guarding.  Musculoskeletal: She  exhibits no edema and no tenderness.  Neurological: She is alert and oriented to person, place, and time. She has normal strength. No cranial nerve deficit or sensory deficit. She displays a negative Romberg sign. Coordination and gait normal. GCS eye subscore is 4. GCS verbal subscore is 5. GCS motor subscore is 6.  No drift Intact finger to nose.  Normal gait and normal tandem gait.  Skin: Skin is warm and dry.  Psychiatric: She has a normal mood and affect. Her behavior is normal.    ED Course  Procedures (including critical care time) Labs Review Labs Reviewed - No data to display  Imaging Review No results found.   EKG Interpretation None      MDM   Final diagnoses:  Headache  Visual changes    43 yo F with headache. Symptoms prior to ha consistent with aura.  NTTP over temporal area. No jaw claudication. Do not suspect temp arteritis.  No recent head trauma.  Neuro intact. Not cw TIA/CVA.  Patient remains well appearing during ED course.  Symptoms resolved with ha cocktail (reglan, decadron, benadryl, IVF).   Doubt idiopathic intracranial hypertension, cerebral venous thrombosis, skull fracture, ICH concussion, trigeminal neuralgia, cluster headache, eye pathology or other emergent pathology as this is an atypical history and physical, low risk, and primary diagnosis is much more likely,.   Dc home. Return precautions given. Follow up with primary care physician. Patient in agreement with plan  Labs and imaging reviewed by myself and considered in medical decision making if ordered. Imaging interpreted by radiology.   Discussed case with Dr. Micheline Mazeocherty who is in agreement with assessment and plan.     Stevie Kernyan Kelise Kuch, MD 05/23/14 [id number]

## 2014-05-22 NOTE — ED Notes (Signed)
Pt. reports temporary bilateral blurred vision at 6 pm this evening , denies injury / no eye pain , ambulatory.

## 2014-05-23 NOTE — ED Provider Notes (Signed)
Medical screening examination/treatment/procedure(s) were conducted as a shared visit with resident-physician practitioner(s) and myself.  I personally evaluated the patient during the encounter.  Pt is a 43 y.o. female with pmhx as above presenting with several episodes of now resolved blurry vision followed by a h/a. Hx of similar symptoms in the past.  Pt found to have no focal neuro findings on my PE, was in NAD. H/a resolved after migraine cocktail. Suspect migraine w/ aura. Will d/c home w/ Return precautions given for new or worsening symptoms including focal neuro complaints, fever  .    Shanna Cisco, MD 05/23/14 1345

## 2014-06-03 ENCOUNTER — Encounter: Payer: Self-pay | Admitting: Family Medicine

## 2014-06-03 ENCOUNTER — Ambulatory Visit (INDEPENDENT_AMBULATORY_CARE_PROVIDER_SITE_OTHER): Payer: Self-pay | Admitting: Family Medicine

## 2014-06-03 VITALS — BP 121/83 | HR 78 | Temp 98.2°F | Ht 66.0 in | Wt 168.0 lb

## 2014-06-03 DIAGNOSIS — R319 Hematuria, unspecified: Secondary | ICD-10-CM

## 2014-06-03 DIAGNOSIS — R109 Unspecified abdominal pain: Secondary | ICD-10-CM

## 2014-06-03 DIAGNOSIS — R1032 Left lower quadrant pain: Secondary | ICD-10-CM

## 2014-06-03 LAB — POCT UA - MICROSCOPIC ONLY

## 2014-06-03 LAB — POCT URINALYSIS DIPSTICK
BILIRUBIN UA: NEGATIVE
Glucose, UA: NEGATIVE
KETONES UA: NEGATIVE
LEUKOCYTES UA: NEGATIVE
Nitrite, UA: NEGATIVE
PH UA: 5.5
PROTEIN UA: NEGATIVE
Spec Grav, UA: <=1.005
Urobilinogen, UA: 0.2

## 2014-06-03 NOTE — Patient Instructions (Signed)
Abdominal Pain - It is difficult to tell what is causing your pain, your urine did not show evidence of an infection however there was some blood, I would like to start a trial of ibuprofen 200-400 mg every six hours as needed for pain, if pain persists or worsens please return to the office

## 2014-06-03 NOTE — Progress Notes (Addendum)
   Subjective:    Patient ID: Rebecca Blevins, female    DOB: 10/25/1971, 43 y.o.   MRN: [id number]  HPI 43 year old female presents for 4-5 day history of left-sided abdominal pain, she describes the pain as sharp in nature, she does have radiation to the left groin area and left anterior thigh, also has pain in low left back with radiation to the right low back, she reports that her symptoms are worse in the evening and with movement, she has associated nausea without emesis, she's had decreased by mouth intake due to nausea, last BM was today, denies constipation or diarrhea, no rash or overlying bruising, denies dysuria, no hematuria, no vaginal discharge, no vaginal irritation, last menstrual period was in April 2014, last sexual intercourse was in February of 2015, she has not taken any over-the-counter pain medications to help alleviate the pain   Review of Systems  Constitutional: Positive for chills. Negative for fever.  Respiratory: Negative for shortness of breath.   Cardiovascular: Negative for chest pain.  Gastrointestinal: Positive for nausea and abdominal pain. Negative for vomiting, diarrhea, constipation, blood in stool and abdominal distention.  Neuro: no weakness in legs, no saddle anesthesia     Objective:   Physical Exam Vitals: Reviewed General: Pleasant African American female, no acute distress Cardiac: Regular rate and rhythm, S1 and S2 present, no murmurs, no heaves or thrills Respiratory: Clear to auscultation bilaterally, normal effort Abdomen: Soft, left lower quadrant and left flank tenderness present, normal bowel sounds, no guarding, pain is worse with removal of pressure in the left lower quadrant, no other peritoneal signs MSK: mild bilateral paraspinal tenderness  Urine dipstick: LE negative, Nitrite negative, moderate blood Urine microscopy: rare WBC, rare RBC, 2+ bacteria, occ Epi  CT abd/pelvis performed in 2009 (done for left lower quadrant abdominal  pain and hematuria) - negative for acute pathology     Assessment & Plan:  Please see problem specific assessment and plan.

## 2014-06-04 ENCOUNTER — Telehealth: Payer: Self-pay | Admitting: Family Medicine

## 2014-06-04 DIAGNOSIS — R319 Hematuria, unspecified: Secondary | ICD-10-CM | POA: Insufficient documentation

## 2014-06-04 DIAGNOSIS — R1032 Left lower quadrant pain: Secondary | ICD-10-CM | POA: Insufficient documentation

## 2014-06-04 NOTE — Telephone Encounter (Signed)
Called patient to come in for lab visit for a repeat urine, her last UA showed hematuria. She will come in tomorrow at 9:30 am.Busick, Rodena Medinobert Lee

## 2014-06-04 NOTE — Assessment & Plan Note (Addendum)
Patient presents for LLQ abdominal pain, left flank pain, and left low back pain. Differential is broad however no current GYN symptoms which suggests against STD, patient has not had periods for over a year suggesting against menstrual cramping, no dysuria which suggests against UTI, Intermittent sharp nature with small amount of blood in urine could represent nephrolithiasis, regular BM suggesting against constipation, in the setting of low back pain may represent MSK etiology, patient has had similar symptoms in 2009 and had negative CT abd/pelvis -after a discussion with the patient is was decided to pursue a trial of NSAID's -she was counseled that if symptoms persist please return to the office, would consider repeat CT abd/pelvis to evaluate for nephrolithasis

## 2014-06-04 NOTE — Addendum Note (Signed)
Addended by: Jennette BillBUSICK, ROBERT L on: 06/04/2014 04:38 PM   Modules accepted: Orders

## 2014-06-04 NOTE — Assessment & Plan Note (Addendum)
Moderate blood on dipstick, rare RBC's on urine microscopy -recheck urine for blood in 1-2 weeks -patient told to make lab appointment to get urine tested

## 2014-06-04 NOTE — Telephone Encounter (Signed)
Discussed lab results with patient. She is feeling better today, no abdominal pain after taking Ibuprofen.

## 2014-06-04 NOTE — Telephone Encounter (Signed)
Note to nursing staff - please call patient to make a lab appointment next week to have recheck of urine (she had blood in urine during visit on 06/03/14), thanks.

## 2014-06-05 ENCOUNTER — Other Ambulatory Visit (INDEPENDENT_AMBULATORY_CARE_PROVIDER_SITE_OTHER): Payer: Self-pay

## 2014-06-05 DIAGNOSIS — R319 Hematuria, unspecified: Secondary | ICD-10-CM

## 2014-06-05 LAB — POCT URINALYSIS DIPSTICK
BILIRUBIN UA: NEGATIVE
GLUCOSE UA: NEGATIVE
Ketones, UA: NEGATIVE
LEUKOCYTES UA: NEGATIVE
NITRITE UA: NEGATIVE
Protein, UA: NEGATIVE
Spec Grav, UA: 1.01
UROBILINOGEN UA: 0.2
pH, UA: 7

## 2014-06-05 LAB — POCT UA - MICROSCOPIC ONLY

## 2014-06-05 NOTE — Progress Notes (Signed)
URINE run,  Small blood present

## 2014-06-06 ENCOUNTER — Telehealth: Payer: Self-pay | Admitting: Family Medicine

## 2014-06-06 DIAGNOSIS — R319 Hematuria, unspecified: Secondary | ICD-10-CM

## 2014-06-06 NOTE — Telephone Encounter (Signed)
Note to nursing staff - please call patient and tell her that she still has blood in her urine, she will need to return to get blood work (BMP and cbc) and repeat urine for culture. Please schedule lab appointment. Thanks.

## 2014-06-17 ENCOUNTER — Telehealth: Payer: Self-pay | Admitting: Family Medicine

## 2014-06-17 NOTE — Telephone Encounter (Signed)
Would like results from blood work

## 2014-06-19 NOTE — Telephone Encounter (Signed)
Left message on voicemail informing of normal urine results.

## 2014-08-15 ENCOUNTER — Encounter: Payer: Self-pay | Admitting: Family Medicine

## 2014-08-15 ENCOUNTER — Other Ambulatory Visit (HOSPITAL_COMMUNITY)
Admission: RE | Admit: 2014-08-15 | Discharge: 2014-08-15 | Disposition: A | Discharge status: Home / Self Care | Payer: Medicaid Other | Source: Ambulatory Visit | Attending: Family Medicine | Admitting: Family Medicine

## 2014-08-15 ENCOUNTER — Ambulatory Visit (INDEPENDENT_AMBULATORY_CARE_PROVIDER_SITE_OTHER): Payer: Medicaid Other | Admitting: Family Medicine

## 2014-08-15 VITALS — BP 100/68 | HR 80 | Temp 98.1°F | Ht 66.0 in | Wt 175.0 lb

## 2014-08-15 DIAGNOSIS — Z0000 Encounter for general adult medical examination without abnormal findings: Secondary | ICD-10-CM

## 2014-08-15 DIAGNOSIS — Z01419 Encounter for gynecological examination (general) (routine) without abnormal findings: Secondary | ICD-10-CM | POA: Insufficient documentation

## 2014-08-15 DIAGNOSIS — R300 Dysuria: Secondary | ICD-10-CM

## 2014-08-15 DIAGNOSIS — R319 Hematuria, unspecified: Secondary | ICD-10-CM

## 2014-08-15 DIAGNOSIS — Z1151 Encounter for screening for human papillomavirus (HPV): Secondary | ICD-10-CM | POA: Insufficient documentation

## 2014-08-15 DIAGNOSIS — Z124 Encounter for screening for malignant neoplasm of cervix: Secondary | ICD-10-CM

## 2014-08-15 DIAGNOSIS — R1032 Left lower quadrant pain: Secondary | ICD-10-CM

## 2014-08-15 LAB — BASIC METABOLIC PANEL
BUN: 9 mg/dL (ref 6–23)
CALCIUM: 9.6 mg/dL (ref 8.4–10.5)
CHLORIDE: 100 meq/L (ref 96–112)
CO2: 32 mEq/L (ref 19–32)
CREATININE: 0.71 mg/dL (ref 0.50–1.10)
Glucose, Bld: 99 mg/dL (ref 70–99)
Potassium: 3.8 mEq/L (ref 3.5–5.3)
Sodium: 137 mEq/L (ref 135–145)

## 2014-08-15 LAB — POCT URINALYSIS DIPSTICK
Bilirubin, UA: NEGATIVE
Glucose, UA: NEGATIVE
KETONES UA: NEGATIVE
Leukocytes, UA: NEGATIVE
Nitrite, UA: NEGATIVE
PH UA: 5.5
PROTEIN UA: NEGATIVE
UROBILINOGEN UA: 0.2

## 2014-08-15 LAB — CBC
HEMATOCRIT: 35.5 % — AB (ref 36.0–46.0)
Hemoglobin: 12.3 g/dL (ref 12.0–15.0)
MCH: 28.7 pg (ref 26.0–34.0)
MCHC: 34.6 g/dL (ref 30.0–36.0)
MCV: 82.9 fL (ref 78.0–100.0)
Platelets: 415 10*3/uL — ABNORMAL HIGH (ref 150–400)
RBC: 4.28 MIL/uL (ref 3.87–5.11)
RDW: 13.6 % (ref 11.5–15.5)
WBC: 5.5 10*3/uL (ref 4.0–10.5)

## 2014-08-15 LAB — POCT UA - MICROSCOPIC ONLY

## 2014-08-15 NOTE — Assessment & Plan Note (Signed)
Patient states she has continued pain from her previous visit 2 months prior. This pain is localized primarily along the left flank. Patient had no complaints of dysuria pyuria changes in bowel movements fevers chills sweats. - Today we obtained a urinalysis which came back positive for moderate blood. - Urine culture was sent out. - BMP and CBC were obtained to rule out any renal dysfunction or evidence of infection. - Pelvic exam yielded no acute cervical motion tenderness or adnexal tenderness. - If urine culture returns negative will strongly consider moving forward with CT scan to rule out nephrolithiasis.

## 2014-08-15 NOTE — Patient Instructions (Signed)
It was a pleasure seeing you today in our clinic. Today we discussed your left-sided flank pain. Here is the treatment plan we have discussed and agreed upon together:  - Today we have obtained a Pap smear. I will contact you if there are any abnormal results. - Today we obtained blood tests. I will contact you with the results. - We also obtained a urinalysis and I will contact you with these results. - If these tests results require any additional testing we will ask to have you return for further tests.
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
## 2014-08-16 LAB — URINE CULTURE
Colony Count: NO GROWTH
ORGANISM ID, BACTERIA: NO GROWTH

## 2014-08-18 ENCOUNTER — Telehealth: Payer: Self-pay | Admitting: Family Medicine

## 2014-08-18 DIAGNOSIS — R3129 Other microscopic hematuria: Secondary | ICD-10-CM

## 2014-08-18 NOTE — Telephone Encounter (Signed)
Patient called to discuss test results. She was found to have microscopic hematuria without evidence of infection. At this time I have scheduled a CT scan of her abdomen/pelvis for rule out of possible nephrolithiasis. A urine pregnancy test has also been ordered to be performed here in clinic prior to CT.  As stated before patient was called to discuss these results and acknowledged understanding of the future tests I have ordered. She will be contacted with further instruction from the nursing staff here in the clinic. And for scheduling of these tests.

## 2014-08-19 ENCOUNTER — Other Ambulatory Visit (INDEPENDENT_AMBULATORY_CARE_PROVIDER_SITE_OTHER): Payer: Medicaid Other

## 2014-08-19 DIAGNOSIS — R3129 Other microscopic hematuria: Secondary | ICD-10-CM

## 2014-08-19 LAB — POCT URINE PREGNANCY: PREG TEST UR: NEGATIVE

## 2014-08-19 LAB — CYTOLOGY - PAP

## 2014-08-19 NOTE — Progress Notes (Signed)
UPT DONE TODAY Rebecca Blevins

## 2014-08-20 NOTE — Telephone Encounter (Signed)
CT scheduled 9/9 at 10am at [facility] hospital-radiology, patient informed.

## 2014-08-27 ENCOUNTER — Ambulatory Visit (HOSPITAL_COMMUNITY)
Admission: RE | Admit: 2014-08-27 | Discharge: 2014-08-27 | Disposition: A | Discharge status: Home / Self Care | Payer: Medicaid Other | Source: Ambulatory Visit | Attending: Family Medicine | Admitting: Family Medicine

## 2014-08-27 ENCOUNTER — Encounter (HOSPITAL_COMMUNITY): Payer: Self-pay

## 2014-08-27 DIAGNOSIS — R3129 Other microscopic hematuria: Secondary | ICD-10-CM | POA: Insufficient documentation

## 2014-08-27 DIAGNOSIS — R109 Unspecified abdominal pain: Secondary | ICD-10-CM | POA: Diagnosis not present

## 2014-09-13 ENCOUNTER — Encounter: Payer: Self-pay | Admitting: Family Medicine

## 2015-10-16 ENCOUNTER — Ambulatory Visit (INDEPENDENT_AMBULATORY_CARE_PROVIDER_SITE_OTHER): Payer: Self-pay | Admitting: Family Medicine

## 2015-10-16 DIAGNOSIS — Z23 Encounter for immunization: Secondary | ICD-10-CM

## 2016-02-06 ENCOUNTER — Encounter (HOSPITAL_COMMUNITY): Payer: Self-pay | Admitting: *Deleted

## 2016-02-06 ENCOUNTER — Emergency Department (HOSPITAL_COMMUNITY)
Admission: EM | Admit: 2016-02-06 | Discharge: 2016-02-06 | Disposition: A | Discharge status: Home / Self Care | Payer: MEDICAID | Attending: Emergency Medicine | Admitting: Emergency Medicine

## 2016-02-06 DIAGNOSIS — G43109 Migraine with aura, not intractable, without status migrainosus: Secondary | ICD-10-CM | POA: Insufficient documentation

## 2016-02-06 LAB — COMPREHENSIVE METABOLIC PANEL
ALBUMIN: 4.1 g/dL (ref 3.5–5.0)
ALT: 12 U/L — AB (ref 14–54)
ANION GAP: 11 (ref 5–15)
AST: 15 U/L (ref 15–41)
Alkaline Phosphatase: 78 U/L (ref 38–126)
BUN: 8 mg/dL (ref 6–20)
CALCIUM: 9.6 mg/dL (ref 8.9–10.3)
CO2: 26 mmol/L (ref 22–32)
Chloride: 104 mmol/L (ref 101–111)
Creatinine, Ser: 0.71 mg/dL (ref 0.44–1.00)
GFR calc non Af Amer: >60 mL/min (ref >60)
GLUCOSE: 123 mg/dL — AB (ref 65–99)
POTASSIUM: 3.9 mmol/L (ref 3.5–5.1)
SODIUM: 141 mmol/L (ref 135–145)
TOTAL PROTEIN: 7.4 g/dL (ref 6.5–8.1)
Total Bilirubin: 0.2 mg/dL — ABNORMAL LOW (ref 0.3–1.2)

## 2016-02-06 LAB — CBC
HEMATOCRIT: 37.7 % (ref 36.0–46.0)
HEMOGLOBIN: 12.7 g/dL (ref 12.0–15.0)
MCH: 29.1 pg (ref 26.0–34.0)
MCHC: 33.7 g/dL (ref 30.0–36.0)
MCV: 86.3 fL (ref 78.0–100.0)
Platelets: 305 10*3/uL (ref 150–400)
RBC: 4.37 MIL/uL (ref 3.87–5.11)
RDW: 12.2 % (ref 11.5–15.5)
WBC: 4.5 10*3/uL (ref 4.0–10.5)

## 2016-02-06 LAB — LIPASE, BLOOD: LIPASE: 35 U/L (ref 11–51)

## 2016-02-06 MED ORDER — PROCHLORPERAZINE EDISYLATE 5 MG/ML IJ SOLN
10.0000 mg | Freq: Once | INTRAMUSCULAR | Status: AC
  Administered 2016-02-06: 10 mg via INTRAVENOUS
  Filled 2016-02-06: qty 2

## 2016-02-06 MED ORDER — BUTALBITAL-APAP-CAFFEINE 50-325-40 MG PO TABS: 1.0000 | ORAL_TABLET | Freq: Four times a day (QID) | ORAL | Status: DC | PRN

## 2016-02-06 MED ORDER — ONDANSETRON 4 MG PO TBDP
4.0000 mg | ORAL_TABLET | Freq: Once | ORAL | Status: AC | PRN
  Administered 2016-02-06: 4 mg via ORAL

## 2016-02-06 MED ORDER — ONDANSETRON 4 MG PO TBDP
ORAL_TABLET | ORAL | Status: AC
  Filled 2016-02-06: qty 1

## 2016-02-06 MED ORDER — DIPHENHYDRAMINE HCL 50 MG/ML IJ SOLN
25.0000 mg | Freq: Once | INTRAMUSCULAR | Status: AC
  Administered 2016-02-06: 25 mg via INTRAVENOUS
  Filled 2016-02-06: qty 1

## 2016-02-06 MED ORDER — SODIUM CHLORIDE 0.9 % IV BOLUS (SEPSIS)
500.0000 mL | Freq: Once | INTRAVENOUS | Status: AC
  Administered 2016-02-06: 500 mL via INTRAVENOUS

## 2016-02-06 MED ORDER — KETOROLAC TROMETHAMINE 30 MG/ML IJ SOLN
30.0000 mg | Freq: Once | INTRAMUSCULAR | Status: AC
  Administered 2016-02-06: 30 mg via INTRAVENOUS
  Filled 2016-02-06: qty 1

## 2016-02-06 NOTE — ED Notes (Signed)
Pt reports waking up this am with headache and n/v. Denies fever or diarrhea. No acute distress noted at triage.

## 2016-02-06 NOTE — Discharge Instructions (Signed)

## 2016-02-06 NOTE — ED Provider Notes (Addendum)
CSN: [id number]     Arrival date & time 02/06/16  1100 History   First MD Initiated Contact with Patient 02/06/16 1721     Chief Complaint  Patient presents with  . Headache  . Emesis   HPI Pt felt well last night.  When she woke up this am around 8am, she had a headache and started vomiting.  The headache was more on the right side, behind the eye.  The headache was severe.  Noise and light exacerbated her symptoms.   She felt a little better when she covered her eyes.  She vomited several times. No diarrhea.  No dysuria.  No fevers or chills.  She was given zofran in the ED while waiting and now is feeling better.  No further vomiting the last few hours and the headache is now a 5/10.  Pt has had this type of headache a few times in the past. The vomiting is a new issue for her. History reviewed. No pertinent past medical history. Past Surgical History  Procedure Laterality Date  . Uvulectomy      as a child in [place]   History reviewed. No pertinent family history. Social History  Substance Use Topics  . Smoking status: Never Smoker   . Smokeless tobacco: None  . Alcohol Use: No   OB History    No data available     Review of Systems  All other systems reviewed and are negative.     Allergies  Review of patient's allergies indicates no known allergies.  Home Medications   Prior to Admission medications   Medication Sig Start Date End Date Taking? Authorizing Provider  butalbital-acetaminophen-caffeine (FIORICET) 50-325-40 MG tablet Take 1-2 tablets by mouth every 6 (six) hours as needed for headache. Max 6 tabs per day 02/06/16 02/05/17  Linwood Dibbles, MD   BP 137/60 mmHg  Pulse 66  Temp(Src) 98.6 F (37 C) (Oral)  Resp 16  SpO2 100%  LMP 04/17/2013 Physical Exam  Constitutional: She appears well-developed and well-nourished. No distress.  HENT:  Head: Normocephalic and atraumatic.  Right Ear: External ear normal.  Left Ear: External ear normal.  Eyes: Conjunctivae  are normal. Pupils are equal, round, and reactive to light. Right eye exhibits no discharge. Left eye exhibits no discharge. No scleral icterus.  Neck: Normal range of motion. Neck supple. No tracheal deviation present.  Cardiovascular: Normal rate, regular rhythm and intact distal pulses.   Pulmonary/Chest: Effort normal and breath sounds normal. No stridor. No respiratory distress. She has no wheezes. She has no rales.  Abdominal: Soft. Bowel sounds are normal. She exhibits no distension. There is no tenderness. There is no rebound and no guarding.  Musculoskeletal: She exhibits no edema or tenderness.  Neurological: She is alert. She has normal strength. No cranial nerve deficit (no facial droop, extraocular movements intact, no slurred speech) or sensory deficit. She exhibits normal muscle tone. She displays no seizure activity. Coordination normal.  Skin: Skin is warm and dry. No rash noted.  Psychiatric: She has a normal mood and affect.  Nursing note and vitals reviewed.   ED Course  Procedures (including critical care time) Labs Review Labs Reviewed  COMPREHENSIVE METABOLIC PANEL - Abnormal; Notable for the following:    Glucose, Bld 123 (*)    ALT 12 (*)    Total Bilirubin 0.2 (*)    All other components within normal limits  LIPASE, BLOOD  CBC  URINALYSIS, ROUTINE W REFLEX MICROSCOPIC (NOT AT [facility])  MDM   Final diagnoses:  Migraine with aura and without status migrainosus, not intractable    I doubt SAH, meningitis, brain tumor. The patient's symptoms are suggestive of a migraine headache. She had photophobia and phonophobia. She also describes what sounds like an aura. Patient has had these type of headaches before.  By the time I saw her, her symptoms were artery significantly better.  Plan on giving her dose of Compazine and Toradol and Benadryl. I will give her prescription for Fioricet. Follow up with her primary doctor as needed.  Return for worsening symptoms,  fever, numbness, weakness.   Linwood Dibbles, MD 02/06/16 1742  Feeling better after treatment.  Sx have resolved.  Ready to go home  Linwood Dibbles, MD 02/06/16 1827

## 2016-03-03 ENCOUNTER — Encounter: Payer: Self-pay | Admitting: Family Medicine

## 2016-03-03 ENCOUNTER — Ambulatory Visit (INDEPENDENT_AMBULATORY_CARE_PROVIDER_SITE_OTHER): Payer: BLUE CROSS/BLUE SHIELD | Admitting: Family Medicine

## 2016-03-03 VITALS — BP 130/78 | HR 71 | Temp 98.0°F | Ht 66.0 in | Wt 161.0 lb

## 2016-03-03 DIAGNOSIS — R51 Headache: Secondary | ICD-10-CM

## 2016-03-03 DIAGNOSIS — R21 Rash and other nonspecific skin eruption: Secondary | ICD-10-CM | POA: Diagnosis not present

## 2016-03-03 DIAGNOSIS — R519 Headache, unspecified: Secondary | ICD-10-CM | POA: Insufficient documentation

## 2016-03-03 LAB — CBC
HEMATOCRIT: 36.8 % (ref 36.0–46.0)
HEMOGLOBIN: 12.7 g/dL (ref 12.0–15.0)
MCH: 29.3 pg (ref 26.0–34.0)
MCHC: 34.5 g/dL (ref 30.0–36.0)
MCV: 85 fL (ref 78.0–100.0)
MPV: 9.1 fL (ref 8.6–12.4)
Platelets: 378 10*3/uL (ref 150–400)
RBC: 4.33 MIL/uL (ref 3.87–5.11)
RDW: 12.9 % (ref 11.5–15.5)
WBC: 4.5 10*3/uL (ref 4.0–10.5)

## 2016-03-03 LAB — TSH: TSH: 0.55 mIU/L

## 2016-03-03 MED ORDER — PREDNISONE 10 MG PO TABS: 30.0000 mg | ORAL_TABLET | Freq: Every day | ORAL | Status: DC

## 2016-03-03 MED ORDER — FLUCONAZOLE 150 MG PO TABS: 150.0000 mg | ORAL_TABLET | Freq: Once | ORAL | Status: DC

## 2016-03-03 NOTE — Progress Notes (Signed)
HEADACHE  Had been seen on 02/06/16 for a severe HA. Ever since then she has a daily HA which is worse w/ activity and at night. HA is better w/ rest. Endorses photophobia/phonophobia  Onset: 02/06/16 Location: Right sided. Behind the Rt eye/frontal/parietal lobe. Throbbing/pounding Quality: severe Frequency: daily Precipitating factors: Activity rior treatment: Fioricet which was given by ED >> doesn't help  Associated Symptoms Nausea/vomiting: yes  Photophobia/phonophobia: yes  Tearing of eyes: no  Sinus pain/pressure: no  Family hx migraine: no  Personal stressors: yes, family issues (didn't want to discuss)  Relation to menstrual cycle: no   Red Flags Fever: no  >> but night sweats Neck pain/stiffness: no  Vision/speech/swallow/hearing difficulty: no  Focal weakness/numbness: no  Altered mental status: no  Trauma: no  New type of headache: yes  Anticoagulant use: no  H/o cancer/HIV/Pregnancy: no    Rash: Patient states that she has had a pruritic skin rash over the past couple weeks to months. She states she has had this in the past and purchased over-the-counter cream/lotions for this. She states that initially this rash began with the appearance of circles on her skin that gradually got larger in size. Now this regular gone and the pruritus affects the majority of her left arm, abdomen, and right leg.  CC, SH/smoking status, and VS noted  Objective: BP 130/78 mmHg  Pulse 71  Temp(Src) 98 F (36.7 C) (Oral)  Ht  (1.676 m)  Wt 161 lb (73.029 kg)  BMI 26.00 kg/m2  LMP 04/17/2013 Gen: NAD, alert, cooperative, and pleasant. HEENT: NCAT, EOMI, PERRL, MMM, no JVD CV: RRR, no murmur Resp: CTAB, no wheezes, non-labored Abd: SNTND, BS present, no guarding or organomegaly Ext: No edema, warm Neuro: Alert and oriented, Speech clear, No gross deficits Integument: Excoriations noted over her left upper extremity and abdomen. Some hypopigmented patches noted in the  affected areas. No evidence of drainage, abscess, erythema.  Assessment and plan:  Headache Patient is endorsing a headache over the past month. Etiology currently unknown. Differential is extensive. Possibilities include cluster headache, tension headache, migraine, anemia, dehydration, hormonal (TSH), infectious (HIV/malaria), etc. Patient has tried Fioricet which was prescribed by the ED. States that this was not very beneficial and her headache comes right back. She had imaging for headaches back in January 2013, this was unremarkable. - I will try a trial of prednisone 5 days due to the distribution consistent with cluster headaches. - I've asked patient to purchase Aleve over-the-counter. I have asked that she try this medication starting in 5 days after she completes the course of steroids if her symptoms persist. - I've encouraged adequate hydration. - I obtained labs today including: HIV, TSH, CBC, CMP - I would like patient to follow-up in 2 weeks to assess her symptoms at that time.  Next: Patient is native to the country of [place]. Because of this she has had a increased risk for parasitic infections. Patient may require a referral to neurology for further specialized workup. Consideration for additional imaging. Consideration for more aggressive treatment for migraine (ie. Triptan).  Rash and nonspecific skin eruption Patient also had a complaint for a skin rash on her arms/belly/leg. Initial description is concerning for tinea corporis versus allergic urticaria rash.  - We'll treat with Diflucan - Patient is receiving oral prednisone as well making a true diagnosis of this issue difficult if she has significant improvement with this treatment.    Orders Placed This Encounter  Procedures  . TSH  .  CBC  . COMPLETE METABOLIC PANEL WITH GFR  . HIV antibody    Meds ordered this encounter  Medications  . fluconazole (DIFLUCAN) 150 MG tablet    Sig: Take 1 tablet (150 mg total)  by mouth once.    Dispense:  4 tablet    Refill:  0  . predniSONE (DELTASONE) 10 MG tablet    Sig: Take 3 tablets (30 mg total) by mouth daily with breakfast.    Dispense:  15 tablet    Refill:  0     Kathee DeltonIan D Mayleigh Tetrault, MD,MS,  PGY2 03/03/2016 5:52 PM

## 2016-03-03 NOTE — Assessment & Plan Note (Signed)
Patient is endorsing a headache over the past month. Etiology currently unknown. Differential is extensive. Possibilities include cluster headache, tension headache, migraine, anemia, dehydration, hormonal (TSH), infectious (HIV/malaria), etc. Patient has tried Fioricet which was prescribed by the ED. States that this was not very beneficial and her headache comes right back. She had imaging for headaches back in January 2013, this was unremarkable. - I will try a trial of prednisone 5 days due to the distribution consistent with cluster headaches. - I've asked patient to purchase Aleve over-the-counter. I have asked that she try this medication starting in 5 days after she completes the course of steroids if her symptoms persist. - I've encouraged adequate hydration. - I obtained labs today including: HIV, TSH, CBC, CMP - I would like patient to follow-up in 2 weeks to assess her symptoms at that time.  Next: Patient is native to the country of [place]. Because of this she has had a increased risk for parasitic infections. Patient may require a referral to neurology for further specialized workup. Consideration for additional imaging. Consideration for more aggressive treatment for migraine (ie. Triptan).

## 2016-03-03 NOTE — Assessment & Plan Note (Signed)
Patient also had a complaint for a skin rash on her arms/belly/leg. Initial description is concerning for tinea corporis versus allergic urticaria rash.  - We'll treat with Diflucan - Patient is receiving oral prednisone as well making a true diagnosis of this issue difficult if she has significant improvement with this treatment.

## 2016-03-03 NOTE — Patient Instructions (Signed)
It was a pleasure seeing you today in our clinic. Today we discussed your headache and skin rash. Here is the treatment plan we have discussed and agreed upon together:   - take the Fluconazole one pill weekly for 4 weeks. - Take the prednisone 30mg  (3 tablets) every day for 5 days. - I encourage you to buy Aleve, over the counter, take this as directed on the bottle for your headache.  - I'd like to see you back in 2 weeks to track your progress.

## 2016-03-04 LAB — COMPLETE METABOLIC PANEL WITHOUT GFR
ALT: 9 U/L (ref 6–29)
AST: 12 U/L (ref 10–35)
Albumin: 4.4 g/dL (ref 3.6–5.1)
Alkaline Phosphatase: 82 U/L (ref 33–115)
BUN: 10 mg/dL (ref 7–25)
CO2: 26 mmol/L (ref 20–31)
Calcium: 9.4 mg/dL (ref 8.6–10.2)
Chloride: 104 mmol/L (ref 98–110)
Creat: 0.82 mg/dL (ref 0.50–1.10)
GFR, Est African American: >89 mL/min
GFR, Est Non African American: 87 mL/min
Glucose, Bld: 111 mg/dL — ABNORMAL HIGH (ref 65–99)
Potassium: 4 mmol/L (ref 3.5–5.3)
Sodium: 139 mmol/L (ref 135–146)
Total Bilirubin: 0.3 mg/dL (ref 0.2–1.2)
Total Protein: 7.7 g/dL (ref 6.1–8.1)

## 2016-03-04 LAB — HIV ANTIBODY (ROUTINE TESTING W REFLEX): HIV: NONREACTIVE

## 2016-03-11 ENCOUNTER — Encounter: Payer: Self-pay | Admitting: Family Medicine

## 2016-03-17 ENCOUNTER — Encounter: Payer: Self-pay | Admitting: Family Medicine

## 2016-03-17 ENCOUNTER — Ambulatory Visit (INDEPENDENT_AMBULATORY_CARE_PROVIDER_SITE_OTHER): Payer: BLUE CROSS/BLUE SHIELD | Admitting: Family Medicine

## 2016-03-17 VITALS — BP 117/70 | HR 76 | Temp 98.2°F | Ht 66.0 in | Wt 156.0 lb

## 2016-03-17 DIAGNOSIS — R51 Headache: Secondary | ICD-10-CM | POA: Diagnosis not present

## 2016-03-17 DIAGNOSIS — R21 Rash and other nonspecific skin eruption: Secondary | ICD-10-CM | POA: Diagnosis not present

## 2016-03-17 DIAGNOSIS — H538 Other visual disturbances: Secondary | ICD-10-CM

## 2016-03-17 DIAGNOSIS — R519 Headache, unspecified: Secondary | ICD-10-CM

## 2016-03-17 MED ORDER — TRIAMCINOLONE ACETONIDE 0.5 % EX OINT: 1.0000 "application " | TOPICAL_OINTMENT | Freq: Two times a day (BID) | CUTANEOUS | Status: DC

## 2016-03-19 NOTE — Assessment & Plan Note (Signed)
Dry, scaly patches on abdomen, arms, legs. Per patient emollients make it worse but it feels better when dried. Looks eczematous - continue topical benadryl prn - rx triamcinolone - discussed need for frequent emollients, may take time to improve

## 2016-03-19 NOTE — Assessment & Plan Note (Signed)
Pt reports blurry vision in left eye that apparently predates headache. Visual acuity normal here - refer to optho or full eval

## 2016-03-19 NOTE — Progress Notes (Signed)
   Subjective:   Rebecca Blevins is a 45 y.o. female with a history of prolonged headache in 2013 and 6 weeks of headache starting January 2017 here for ha f/u.  Pt reports throbbing temporal headaches have now stopped, she has not had any pain today or yesterday and this is the first time this has happened since January. She reports that she finished the prednisone burst several days before the pain stopped. She is not taking any medications any more. She reports she has had some blurry vision in her left eye but this was present before the headache started.   Review of Systems:  Per HPI. All other systems reviewed and are negative.   PMH, PSH, Medications, Allergies, and FmHx reviewed and updated in EMR.  Social History: never smoker  Objective:  BP 117/70 mmHg  Pulse 76  Temp(Src) 98.2 F (36.8 C) (Oral)  Ht '5\' 6"'$  (1.676 m)  Wt 156 lb (70.761 kg)  BMI 25.19 kg/m2  LMP 04/17/2013  Gen:  46 y.o. female in NAD HEENT: NCAT, MMM, EOMI, PERRL, anicteric sclerae CV: RRR, no MRG, no JVD Resp: Non-labored, CTAB, no wheezes noted Abd: Soft, NTND, BS present, no guarding or organomegaly Neuro: Alert and oriented, speech normal, CN II-XII intact, normal strength and sensation throughout Skin: dry scaly patches on abdomen, arms and legs      Chemistry      Component Value Date/Time   NA 139 03/03/2016 1656   K 4.0 03/03/2016 1656   CL 104 03/03/2016 1656   CO2 26 03/03/2016 1656   BUN 10 03/03/2016 1656   CREATININE 0.82 03/03/2016 1656   CREATININE 0.71 02/06/2016 1158      Component Value Date/Time   CALCIUM 9.4 03/03/2016 1656   ALKPHOS 82 03/03/2016 1656   AST 12 03/03/2016 1656   ALT 9 03/03/2016 1656   BILITOT 0.3 03/03/2016 1656      Lab Results  Component Value Date   WBC 4.5 03/03/2016   HGB 12.7 03/03/2016   HCT 36.8 03/03/2016   MCV 85.0 03/03/2016   PLT 378 03/03/2016   Lab Results  Component Value Date   TSH 0.55 03/03/2016   No results found for:  HGBA1C Assessment & Plan:     Rebecca Blevins is a 45 y.o. female here for headache f/u  Rash and nonspecific skin eruption Dry, scaly patches on abdomen, arms, legs. Per patient emollients make it worse but it feels better when dried. Looks eczematous - continue topical benadryl prn - rx triamcinolone - discussed need for frequent emollients, may take time to improve  Headache Migrainous HA daily for about 6 weeks. Resolved 2 days ago and now feels fine.  - pt to return if headache does and would order CT and ESR at that time.  Blurry vision, left eye Pt reports blurry vision in left eye that apparently predates headache. Visual acuity normal here - refer to optho or full eval      Beverlyn Roux, MD, MPH [facility] PGY-3 03/19/2016 8:32 PM

## 2016-03-19 NOTE — Assessment & Plan Note (Signed)
Migrainous HA daily for about 6 weeks. Resolved 2 days ago and now feels fine.  - pt to return if headache does and would order CT and ESR at that time.

## 2016-05-13 ENCOUNTER — Other Ambulatory Visit: Payer: Self-pay

## 2016-05-13 DIAGNOSIS — R21 Rash and other nonspecific skin eruption: Secondary | ICD-10-CM

## 2016-05-13 NOTE — Telephone Encounter (Signed)
Pt came into office and asked for a refill of Triamcinolone Acetonide. Sunday SpillersSharon T Veronia Laprise, CMA

## 2016-05-17 NOTE — Telephone Encounter (Signed)
Patient still has all 5 refills remaining at pharmacy (per pharmacist). No need for refill order at this time.

## 2016-08-27 ENCOUNTER — Encounter (HOSPITAL_COMMUNITY): Payer: Self-pay | Admitting: Emergency Medicine

## 2016-08-27 ENCOUNTER — Emergency Department (HOSPITAL_COMMUNITY)
Admission: EM | Admit: 2016-08-27 | Discharge: 2016-08-28 | Disposition: A | Discharge status: Home / Self Care | Payer: BLUE CROSS/BLUE SHIELD | Attending: Emergency Medicine | Admitting: Emergency Medicine

## 2016-08-27 DIAGNOSIS — E860 Dehydration: Secondary | ICD-10-CM | POA: Insufficient documentation

## 2016-08-27 DIAGNOSIS — R112 Nausea with vomiting, unspecified: Secondary | ICD-10-CM | POA: Diagnosis present

## 2016-08-27 DIAGNOSIS — R197 Diarrhea, unspecified: Secondary | ICD-10-CM | POA: Insufficient documentation

## 2016-08-27 LAB — CBC
HEMATOCRIT: 41.4 % (ref 36.0–46.0)
Hemoglobin: 13.9 g/dL (ref 12.0–15.0)
MCH: 29.1 pg (ref 26.0–34.0)
MCHC: 33.6 g/dL (ref 30.0–36.0)
MCV: 86.8 fL (ref 78.0–100.0)
PLATELETS: 334 10*3/uL (ref 150–400)
RBC: 4.77 MIL/uL (ref 3.87–5.11)
RDW: 12.4 % (ref 11.5–15.5)
WBC: 15 10*3/uL — ABNORMAL HIGH (ref 4.0–10.5)

## 2016-08-27 LAB — POC URINE PREG, ED: Preg Test, Ur: NEGATIVE

## 2016-08-27 MED ORDER — SODIUM CHLORIDE 0.9 % IV BOLUS (SEPSIS)
1000.0000 mL | Freq: Once | INTRAVENOUS | Status: AC
  Administered 2016-08-27: 1000 mL via INTRAVENOUS

## 2016-08-27 MED ORDER — ONDANSETRON HCL 4 MG/2ML IJ SOLN
4.0000 mg | Freq: Once | INTRAMUSCULAR | Status: AC
  Administered 2016-08-27: 4 mg via INTRAVENOUS
  Filled 2016-08-27: qty 2

## 2016-08-27 NOTE — ED Triage Notes (Signed)
Patient with abdominal pain and nausea.  Patient started about 6 hours ago.  She has been vomiting with the pain and nausea.  Family states that she has not been able to eat or drink since starting the vomiting.
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
## 2016-08-28 LAB — COMPREHENSIVE METABOLIC PANEL
ALBUMIN: 4.4 g/dL (ref 3.5–5.0)
ALK PHOS: 80 U/L (ref 38–126)
ALT: 14 U/L (ref 14–54)
ANION GAP: 6 (ref 5–15)
AST: 28 U/L (ref 15–41)
BUN: 15 mg/dL (ref 6–20)
CALCIUM: 9.3 mg/dL (ref 8.9–10.3)
CHLORIDE: 108 mmol/L (ref 101–111)
CO2: 25 mmol/L (ref 22–32)
Creatinine, Ser: 0.82 mg/dL (ref 0.44–1.00)
GFR calc Af Amer: >60 mL/min (ref >60)
GFR calc non Af Amer: >60 mL/min (ref >60)
GLUCOSE: 133 mg/dL — AB (ref 65–99)
Potassium: 3.9 mmol/L (ref 3.5–5.1)
SODIUM: 139 mmol/L (ref 135–145)
Total Bilirubin: 1.1 mg/dL (ref 0.3–1.2)
Total Protein: 8 g/dL (ref 6.5–8.1)

## 2016-08-28 LAB — URINE MICROSCOPIC-ADD ON

## 2016-08-28 LAB — URINALYSIS, ROUTINE W REFLEX MICROSCOPIC
Bilirubin Urine: NEGATIVE
GLUCOSE, UA: NEGATIVE mg/dL
Ketones, ur: 15 mg/dL — AB
LEUKOCYTES UA: NEGATIVE
Nitrite: NEGATIVE
PH: 5 (ref 5.0–8.0)
Protein, ur: NEGATIVE mg/dL
SPECIFIC GRAVITY, URINE: 1.016 (ref 1.005–1.030)

## 2016-08-28 LAB — LIPASE, BLOOD: Lipase: 37 U/L (ref 11–51)

## 2016-08-28 MED ORDER — KETOROLAC TROMETHAMINE 30 MG/ML IJ SOLN
15.0000 mg | Freq: Once | INTRAMUSCULAR | Status: AC
  Administered 2016-08-28: 15 mg via INTRAVENOUS
  Filled 2016-08-28: qty 1

## 2016-08-28 MED ORDER — ONDANSETRON 8 MG PO TBDP: ORAL_TABLET | ORAL | 0 refills | Status: DC

## 2016-08-28 MED ORDER — SODIUM CHLORIDE 0.9 % IV BOLUS (SEPSIS)
1000.0000 mL | Freq: Once | INTRAVENOUS | Status: AC
Start: 2016-08-28 — End: 2016-08-28
  Administered 2016-08-28: 1000 mL via INTRAVENOUS

## 2016-08-28 NOTE — ED Notes (Signed)
Pt ambulated to the bathroom and was given po fluids

## 2016-08-28 NOTE — Discharge Instructions (Signed)

## 2016-10-06 ENCOUNTER — Ambulatory Visit (INDEPENDENT_AMBULATORY_CARE_PROVIDER_SITE_OTHER): Payer: BLUE CROSS/BLUE SHIELD | Admitting: Family Medicine

## 2016-10-06 ENCOUNTER — Encounter: Payer: Self-pay | Admitting: Family Medicine

## 2016-10-06 VITALS — BP 118/72 | HR 69 | Temp 98.3°F | Ht 66.0 in | Wt 155.8 lb

## 2016-10-06 DIAGNOSIS — M2550 Pain in unspecified joint: Secondary | ICD-10-CM

## 2016-10-06 DIAGNOSIS — R202 Paresthesia of skin: Secondary | ICD-10-CM | POA: Diagnosis not present

## 2016-10-06 DIAGNOSIS — M7742 Metatarsalgia, left foot: Secondary | ICD-10-CM

## 2016-10-06 LAB — BASIC METABOLIC PANEL WITH GFR
BUN: 8 mg/dL (ref 7–25)
CO2: 28 mmol/L (ref 20–31)
Calcium: 9.8 mg/dL (ref 8.6–10.2)
Chloride: 104 mmol/L (ref 98–110)
Creat: 0.75 mg/dL (ref 0.50–1.10)
GLUCOSE: 103 mg/dL — AB (ref 65–99)
POTASSIUM: 4.2 mmol/L (ref 3.5–5.3)
Sodium: 142 mmol/L (ref 135–146)

## 2016-10-06 LAB — POCT SEDIMENTATION RATE: POCT SED RATE: 12 mm/hr (ref 0–22)

## 2016-10-06 LAB — VITAMIN B12: Vitamin B-12: 247 pg/mL (ref 200–1100)

## 2016-10-06 MED ORDER — NAPROXEN 500 MG PO TABS: 500.0000 mg | ORAL_TABLET | Freq: Two times a day (BID) | ORAL | 0 refills | Status: DC

## 2016-10-06 NOTE — Patient Instructions (Signed)
It was a pleasure seeing you today in our clinic. Today we discussed your left foot pain, back pain and shoulder pain. Here is the treatment plan we have discussed and agreed upon together:   - Today I've ordered some labs to see if there is any underlying process that may help us understand how to better treat you. - I placed referral to sports medicine. I believe that he would likely benefit from some shoe inserts that could help support the area of your foot that is called the "transverse arch". - I prescribed you naproxen. Take 1 tablet twice a day with food every day for the next 5 days. Then he may take this twice a day as needed for pain/discomfort.

## 2016-10-06 NOTE — Assessment & Plan Note (Signed)
Patient complaining of full body joint aches. Primarily her shoulders, back, knees, legs, toes, and fingers. No obvious signs or symptoms of rheumatologic source at this time. Discussed the possibility of depression. PHQ9 obtained in clinic yielded a score of 3 >> however patient scored "0's" on all except for "feeling down, depressed, or hopeless" in which she scored a 3. - Obtaining inflammatory labs: ESR/CRP - Obtaining BMP - Obtaining vitamin B-12 due to vague paresthesias in left foot.  Next: Strong consideration for initiation of SSRI due to full body pain as well as patient's score on PHQ9 (as described above)

## 2016-10-06 NOTE — Progress Notes (Signed)
HPI  CC: Body pain, worse in left foot Patient is here with complaints of body pain and aching. She states that pain is worse first thing in the morning and when laying down in general. She states that pain is worse along the lateral aspect of her left foot. Pain is described as achy in nature. She denies any injury or trauma. She occasionally experiences a different type of pain that she describes as "pins" along the lateral aspect of the left foot. This type of pain is not experienced elsewhere in the body. She cannot think of anything that seems to elicit any of this pain. She cannot think of any thing that makes this pain worse other than cold water (warm water occasionally helps). She has not taken any medications at this time. She denies any weakness, numbness. No new gait changes. No joint swelling or redness. No rashes.  Review of Systems   See HPI for ROS.   CC, SH/smoking status, and VS noted  Objective: BP 118/72   Pulse 69   Temp 98.3 F (36.8 C) (Oral)   Ht _0  (1.676 m)   Wt 155 lb 12.8 oz (70.7 kg)   LMP 04/17/2013   SpO2 99%   BMI 25.15 kg/m  Gen: NAD, alert, cooperative. CV: Well-perfused. Resp: Non-labored. Neuro: Sensation intact throughout. DNRs +2 bilat MSK: Full ROM in all 4 extremities. Strength 5/5 throughout. No obvious limitations or deformities. No effusions. Bilateral feet notable for collapse of transverse arches bilaterally. Left foot notable for valgus deviation at the tibiotalar joint, tenderness at the distal fifth metatarsal along the lateral and plantar surfaces, no tenderness along the extensor tendons, tenderness with squeeze of the distal metatarsals. No pain/tenderness at the plantar fascia or Achilles tendons bilaterally.   Assessment and plan:  Metatarsalgia of left foot Patient is experiencing metatarsalgia along the distal fifth metatarsal. Physical exam notable for collapse of the transverse arch. No weakness or numbness noted. No  evidence of plantar fasciitis or tendinitis appreciated. - Patient would likely benefit from a shoe insert with a metatarsal supporting pad. Will refer to sports medicine to further assess patient's gait and set up for shoe inserts. - Encouraged supportive foot wear. - Naproxen 500 mg twice a day 5 days then as needed twice a day after that.  Arthralgia Patient complaining of full body joint aches. Primarily her shoulders, back, knees, legs, toes, and fingers. No obvious signs or symptoms of rheumatologic source at this time. Discussed the possibility of depression. PHQ9 obtained in clinic yielded a score of 3 >> however patient scored "0's" on all except for "feeling down, depressed, or hopeless" in which she scored a 3. - Obtaining inflammatory labs: ESR/CRP - Obtaining BMP - Obtaining vitamin B-12 due to vague paresthesias in left foot.  Next: Strong consideration for initiation of SSRI due to full body pain as well as patient's score on PHQ9 (as described above)   Orders Placed This Encounter  Procedures  . Vitamin B12  . BASIC METABOLIC PANEL WITH GFR  . C-reactive protein  . Ambulatory referral to Sports Medicine    Referral Priority:   Routine    Referral Type:   Consultation    Number of Visits Requested:   1  . POCT SEDIMENTATION RATE    Meds ordered this encounter  Medications  . naproxen (NAPROSYN) 500 MG tablet    Sig: Take 1 tablet (500 mg total) by mouth 2 (two) times daily with a meal.  Dispense:  30 tablet    Refill:  0     Elberta Leatherwood, MD,MS,  PGY3 10/06/2016 5:47 PM

## 2016-10-06 NOTE — Assessment & Plan Note (Addendum)
Patient is experiencing metatarsalgia along the distal fifth metatarsal. Physical exam notable for collapse of the transverse arch. No weakness or numbness noted. No evidence of plantar fasciitis or tendinitis appreciated. - Patient would likely benefit from a shoe insert with a metatarsal supporting pad. Will refer to sports medicine to further assess patient's gait and set up for shoe inserts. - Encouraged supportive foot wear. - Naproxen 500 mg twice a day 5 days then as needed twice a day after that.

## 2016-10-07 LAB — C-REACTIVE PROTEIN: CRP: 2.5 mg/L (ref <8.0)

## 2016-10-24 ENCOUNTER — Other Ambulatory Visit: Payer: Self-pay | Admitting: Family Medicine

## 2016-11-04 ENCOUNTER — Ambulatory Visit (INDEPENDENT_AMBULATORY_CARE_PROVIDER_SITE_OTHER): Payer: BLUE CROSS/BLUE SHIELD | Admitting: Family Medicine

## 2016-11-04 ENCOUNTER — Encounter: Payer: Self-pay | Admitting: Family Medicine

## 2016-11-04 ENCOUNTER — Ambulatory Visit: Payer: BLUE CROSS/BLUE SHIELD | Admitting: Family Medicine

## 2016-11-04 VITALS — BP 101/70 | HR 70 | Temp 98.0°F | Wt 162.4 lb

## 2016-11-04 DIAGNOSIS — Z23 Encounter for immunization: Secondary | ICD-10-CM

## 2016-11-04 DIAGNOSIS — M7742 Metatarsalgia, left foot: Secondary | ICD-10-CM

## 2016-11-04 NOTE — Patient Instructions (Signed)
MWUXLKSakina, you were seen today for follow-up for left foot pain and I'm glad to hear that you're feeling much better.  I would continue with what your doing and taking the naproxen as needed.  I put another referral for sports medicine clinic and left your son's phone number as a contact and they can leave a message if they're not able to get through to him.   If you do not receive a phone call in the next week or so I would call us back here and we can make sure you get an appointment.  Very nice meeting you, Reuel BoomDaniel L. Myrtie SomanWarden, MD [facility] Resident PGY-1 11/04/2016 9:31 AM

## 2016-11-04 NOTE — Assessment & Plan Note (Signed)
Patient presenting for follow-up visit for metatarsalgia of the left foot on the distal fifth metatarsal.  Reports that pain is improved, but is still tender to palpation with pain rated 5-6 out of 10.  Improved with naproxen and foot soaks in hot water after long days at work.  Stated that she did not receive a phone call from sports medicine to schedule appointment. - Continue with naproxen as needed and foot soaks - Placed another sports medicine referral and gave reliable contact number and instructed patient to call back in one week if she does not receive an appointment

## 2016-11-04 NOTE — Progress Notes (Signed)
    Subjective:  Rebecca BreedingSakina M Shaff is a 45 y.o. female who [facility] with a chief complaint of feft foot pain  HPI:  Left foot pain: Patient here today for follow-up for left foot pain diagnosed as metatarsalgia of the distal fifth metatarsal. She has collapse of the transverse arches bilaterally.  She was recommended to take naproxen 2 times daily for 5 days and when necessary thereafter. States that pain is much improved recently. Denies any numbness or tingling.  Rates the pain about 5-6 out of 10 with palpation. Was supposed to see sports medicine after last visit, but stated that they never contacted her.  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    PMH: Metatarsalgia of left foot, cervicalgia Tobacco use: Never smoked Medication: reviewed and updated ROS: see HPI   Objective:  Physical Exam: BP 101/70 (BP Location: Right Arm, Patient Position: Sitting, Cuff Size: Normal)   Pulse 70   Temp 98 F (36.7 C) (Oral)   Wt 162 lb 6.4 oz (73.7 kg)   LMP 04/17/2013   BMI 26.21 kg/m   Gen: 45 year old female in NAD, resting comfortably CV: RRR with no murmurs appreciated Pulm: NWOB, CTAB with no crackles, wheezes, or rhonchi GI: Normal bowel sounds present. Soft, Nontender, Nondistended. MSK: Strength 5 out of 5 lower extremities, tenderness to palpation of the distal fifth metatarsal on the left foot. No pain with inversion or eversion of the left ankle, and no ATFL, CFL or PTFL tenderness. No swelling or edema and no bony abnormalities noted on lower extremities. Collapse of the transverse arches bilaterally Skin: warm, dry Neuro: grossly normal, moves all extremities Psych: Normal affect and thought content  No results found for this or any previous visit (from the past 72 hour(s)).   Assessment/Plan:  Metatarsalgia of left foot Patient presenting for follow-up visit for metatarsalgia of the left foot on the distal fifth  metatarsal.  Reports that pain is improved, but is still tender to palpation with pain rated 5-6 out of 10.  Improved with naproxen and foot soaks in hot water after long days at work.  Stated that she did not receive a phone call from sports medicine to schedule appointment. - Continue with naproxen as needed and foot soaks - Placed another sports medicine referral and gave reliable contact number and instructed patient to call back in one week if she does not receive an appointment

## 2016-11-15 ENCOUNTER — Ambulatory Visit (INDEPENDENT_AMBULATORY_CARE_PROVIDER_SITE_OTHER): Payer: BLUE CROSS/BLUE SHIELD | Admitting: Sports Medicine

## 2016-11-15 ENCOUNTER — Encounter: Payer: Self-pay | Admitting: Sports Medicine

## 2016-11-15 VITALS — BP 103/74 | Ht 66.0 in | Wt 155.0 lb

## 2016-11-15 DIAGNOSIS — M7742 Metatarsalgia, left foot: Secondary | ICD-10-CM

## 2016-11-15 NOTE — Progress Notes (Signed)
  D[place] BreedingSakina M Dizdarevic - 45 y.o. female MRN [id number]  Date of birth: 05/02/1971  SUBJECTIVE:  Including CC & ROS.   Neldon NewportSakina Cristopher PeruM Hanko is a 45 y.o. F presenting for evaluation of L foot pain.  Pain is described as shooting pain in lateral distal left 5th metatarsal.  Pain has been present for about 6 weeks.  Pain is constant low-level pain that intermittently worsens to 7-8/10.  She has tried soaking her feet at the end of the day and taking naproxen which has helped some.  She denies any injury or trauma to the area or numbness/weakness.  She does not regularly exercise, but does work as a Buyer, retailhousekeeping inspector at [employer], which requires her to be on her feet and walk a lot at work daily.  HISTORY: Past Medical, Surgical, Social, and Family History Reviewed & Updated per EMR.   Pertinent Historical Findings include: PMSHx -  cervicalgia PSHx -  Buyer, retailHousekeeping inspector at a hotel as above FHx -  noncontributory Medications - naproxen  DATA REVIEWED: PCP clinic notes  PHYSICAL EXAM:  VS: BP:103/74  HR: bpm  TEMP: ( )  RESP:   HT:5\' 6"  (167.6 cm)   WT:155 lb (70.3 kg)  BMI:25.1 PHYSICAL EXAM: Gen: NAD, alert, cooperative with exam, well-appearing HEENT: clear conjunctiva, EOMI CV:  no edema, capillary refill brisk,  Resp: non-labored, normal speech Skin: no rashes, normal turgor  Neuro: no gross deficits.  Psych:  alert and oriented  B/l feet: TTP over lateral L distal fifth metatarsal.  No TTP over other metatarsals of base of fifth metatarsal.  No ankle TTP. Strength intact in ankle plantar and dorsi flexion.  DP and PT pulses palpable b/l.  Sensation intact.  Callus over L fifth toe.  Negative metatarsal squeeze test of L foot.Loss of transverse arches and wide forefoot b/l.    ASSESSMENT & PLAN:   Metatarsalgia of left foot Likely related to wide forefoot and collapse of transverse arch with being on feet all day causing pressure on tender area Possible bursitis vs early bunionette  over lateral distal fifth L metatarsal Green sports insoles with metatarsal pad in work shoes Use moleskin for additional cushion over distal fifth L metatarsal Choose shoes with wide toe box F/u prn   Erasmo DownerAngela M Bacigalupo, MD, MPH PGY-3,  [facility] 11/15/2016 10:33 AM   Patient seen and evaluated with the resident. I agree with the above plan of care.

## 2016-11-15 NOTE — Patient Instructions (Signed)
Bring back your sneakers that you wear to work at your convenience.  Call before you come.  We will make sure the toe is wide enough for you and give you an insert with a pad under the front of your foot to help with the pain.  Also buy some moleskin at the pharmacy to cut a small piece to put over the spot that hurts on the outside of your foot.

## 2016-11-15 NOTE — Assessment & Plan Note (Signed)
Likely related to wide forefoot and collapse of transverse arch with being on feet all day causing pressure on tender area Possible bursitis vs early bunionette over lateral distal fifth L metatarsal Green sports insoles with metatarsal pad in work shoes Use moleskin for additional cushion over distal fifth L metatarsal Choose shoes with wide toe box F/u prn

## 2017-09-08 ENCOUNTER — Ambulatory Visit (INDEPENDENT_AMBULATORY_CARE_PROVIDER_SITE_OTHER): Payer: BLUE CROSS/BLUE SHIELD | Admitting: Internal Medicine

## 2017-09-08 ENCOUNTER — Encounter: Payer: Self-pay | Admitting: Internal Medicine

## 2017-09-08 VITALS — BP 118/72 | HR 68 | Temp 98.3°F | Ht 66.0 in | Wt 164.0 lb

## 2017-09-08 DIAGNOSIS — M5442 Lumbago with sciatica, left side: Secondary | ICD-10-CM

## 2017-09-08 MED ORDER — CYCLOBENZAPRINE HCL 5 MG PO TABS: 5.0000 mg | ORAL_TABLET | Freq: Three times a day (TID) | ORAL | 0 refills | Status: DC | PRN

## 2017-09-08 MED ORDER — GABAPENTIN 300 MG PO CAPS: 300.0000 mg | ORAL_CAPSULE | Freq: Every day | ORAL | 0 refills | Status: DC

## 2017-09-08 NOTE — Patient Instructions (Signed)
Rebecca Blevins,  I believe you have sciatica, which is nerve pain usually caused by pressure on a nerve in your back.  Please try gabapentin 300 mg at bedtime. I have also prescribed the muscle relaxant flexeril 5 mg to try during the day to help with the pain that wraps around your back. Continue ibuprofen and tylenol as needed.  I will write you out of work for the next few days. Try light stretches--don't stay in bed all day!  Please see [place] back at the end of next week to see how you are doing.  Best, Dr. Sampson Goon   Sciatica Sciatica is pain, numbness, weakness, or tingling along your sciatic nerve. The sciatic nerve starts in the lower back and goes down the back of each leg. Sciatica happens when this nerve is pinched or has pressure put on it. Sciatica usually goes away on its own or with treatment. Sometimes, sciatica may keep coming back (recur). Follow these instructions at home: Medicines  Take over-the-counter and prescription medicines only as told by your doctor.  Do not drive or use heavy machinery while taking prescription pain medicine. Managing pain  If directed, put ice on the affected area. ? Put ice in a plastic bag. ? Place a towel between your skin and the bag. ? Leave the ice on for 20 minutes, 2-3 times a day.  After icing, apply heat to the affected area before you exercise or as often as told by your doctor. Use the heat source that your doctor tells you to use, such as a moist heat pack or a heating pad. ? Place a towel between your skin and the heat source. ? Leave the heat on for 20-30 minutes. ? Remove the heat if your skin turns bright red. This is especially important if you are unable to feel pain, heat, or cold. You may have a greater risk of getting burned. Activity  Return to your normal activities as told by your doctor. Ask your doctor what activities are safe for you. ? Avoid activities that make your sciatica worse.  Take short rests during  the day. Rest in a lying or standing position. This is usually better than sitting to rest. ? When you rest for a long time, do some physical activity or stretching between periods of rest. ? Avoid sitting for a long time without moving. Get up and move around at least one time each hour.  Exercise and stretch regularly, as told by your doctor.  Do not lift anything that is heavier than 10 lb (4.5 kg) while you have symptoms of sciatica. ? Avoid lifting heavy things even when you do not have symptoms. ? Avoid lifting heavy things over and over.  When you lift objects, always lift in a way that is safe for your body. To do this, you should: ? Bend your knees. ? Keep the object close to your body. ? Avoid twisting. General instructions  Use good posture. ? Avoid leaning forward when you are sitting. ? Avoid hunching over when you are standing.  Stay at a healthy weight.  Wear comfortable shoes that support your feet. Avoid wearing high heels.  Avoid sleeping on a mattress that is too soft or too hard. You might have less pain if you sleep on a mattress that is firm enough to support your back.  Keep all follow-up visits as told by your doctor. This is important. Contact a doctor if:  You have pain that: ? Wakes you up  when you are sleeping. ? Gets worse when you lie down. ? Is worse than the pain you have had in the past. ? Lasts longer than 4 weeks.  You lose weight for without trying. Get help right away if:  You cannot control when you pee (urinate) or poop (have a bowel movement).  You have weakness in any of these areas and it gets worse. ? Lower back. ? Lower belly (pelvis). ? Butt (buttocks). ? Legs.  You have redness or swelling of your back.  You have a burning feeling when you pee. This information is not intended to replace advice given to you by your health care provider. Make sure you discuss any questions you have with your health care provider. Document  Released: 09/13/2008 Document Revised: 05/12/2016 Document Reviewed: 08/14/2015 Elsevier Interactive Patient Education  [employer].

## 2017-09-10 DIAGNOSIS — M5442 Lumbago with sciatica, left side: Secondary | ICD-10-CM | POA: Insufficient documentation

## 2017-09-10 NOTE — Progress Notes (Signed)
[facility] Progress Note  Subjective:  Rebecca Blevins is a 46 y.o. female with history of MSK complaints who presents for back pain for the last 5 days. She describes a shooting pain down the outer aspect of her left leg. Back pain worsened by sitting and walking. She denies any distinct injury or falls. She does very physical work of house cleaning. She has tried advil, which helps some. Pain also radiates across her lower back. She denies having had similar pain in the past. Does report some abdominal pain x 1 day but denies pain with urination, hematuria or n/v. Having regular BMs. ROS: No fevers, no rash, no bowel or bladder incontinence  Allergies  Allergen Reactions  . Pork-Derived Products Other (See Comments)    No pork for religious reasons   Social: Never smoker  Objective: Blood pressure 118/72, pulse 68, temperature 98.3 F (36.8 C), temperature source Oral, height  (1.676 m), weight 164 lb (74.4 kg), last menstrual period 04/17/2013, SpO2 99 %.  Constitutional: Well-appearing female, resting on her side when first entered room HENT: MMM Cardiovascular: RRR, S1, S2, no m/r/g.  Pulmonary/Chest: Effort normal and breath sounds normal. No respiratory distress.  Abdominal: Soft. +BS, NT, ND Musculoskeletal: No CVA tenderness to percussion. Midline spinal TTP over thoraco/lumbar spine. Able to perform lateral and forward flexion of spine. Mild TTP over lumbar paraspinal muscles. No radiation of pain with straight leg raise. No pain with palpation over R or L greater trochanter but some discomfort with internal rotation of L hip  Neurological: AOx3, no focal deficits. 1+ patellar reflexes, 2+ Achilles reflex bilaterally. Sensation intact to soft, sharp and temperature across upper and LEs.  Skin: Skin is warm and dry. No rash noted.  Psychiatric: Normal mood and affect.  Vitals reviewed  Reviewed CT abd/pelvis from 08/2014, which showed intact disc spaces of  thoracolumbar spine.    Assessment/Plan: Acute midline low back pain with left-sided sciatica - Patient describes symptoms consistent with sciatica, though cannot elicit symptoms on today's exam. This with midline spinal tenderness points to DDD but could also be herniated disc. Could also have some SI joint dysfunction.  - Recommended continuing ibuprofen and tylenol prn and trying gabapentin 300 mg QHS. Can also try flexeril 5 mg during the day. - Wrote note for patient to stay out of work over the weekend. Encouraged her to do some light stretching, though, and to not just stay in bed.  - Could consider thoracolumbar x-ray if no improvement at f/u.   Follow-up in about a week to assess for improvement.  [name], MD [facility], PGY-3

## 2017-09-10 NOTE — Assessment & Plan Note (Signed)
-   Patient describes symptoms consistent with sciatica, though cannot elicit symptoms on today's exam. This with midline spinal tenderness points to DDD but could also be herniated disc. Could also have some SI joint dysfunction.  - Recommended continuing ibuprofen and tylenol prn and trying gabapentin 300 mg QHS. Can also try flexeril 5 mg during the day. - Wrote note for patient to stay out of work over the weekend. Encouraged her to do some light stretching, though, and to not just stay in bed.  - Could consider thoracolumbar x-ray if no improvement at f/u.

## 2018-02-06 ENCOUNTER — Ambulatory Visit: Payer: BLUE CROSS/BLUE SHIELD | Admitting: Family Medicine

## 2018-02-06 DIAGNOSIS — L309 Dermatitis, unspecified: Secondary | ICD-10-CM | POA: Insufficient documentation

## 2018-02-06 DIAGNOSIS — G8929 Other chronic pain: Secondary | ICD-10-CM

## 2018-02-06 DIAGNOSIS — M25511 Pain in right shoulder: Secondary | ICD-10-CM

## 2018-02-06 DIAGNOSIS — R21 Rash and other nonspecific skin eruption: Secondary | ICD-10-CM | POA: Diagnosis not present

## 2018-02-06 DIAGNOSIS — M25519 Pain in unspecified shoulder: Secondary | ICD-10-CM | POA: Insufficient documentation

## 2018-02-06 MED ORDER — CETIRIZINE HCL 10 MG PO TABS
10.0000 mg | ORAL_TABLET | Freq: Every day | ORAL | 11 refills | Status: DC
Start: 2018-02-06 — End: 2020-05-20
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
## 2018-02-06 NOTE — Patient Instructions (Signed)
Thank you for coming in today, it was so nice to see you! Today we talked about:    Rash: I think that this is likely eczema.  Since that is all over your body you will need to be seen by a dermatologist.  Continue using the triamcinolone in the morning and at night every single day.  Apply the Vaseline to your entire body every day.  They only use gentle soaps for bathing.  You can use the Zyrtec for itching.  Right shoulder pain: This is likely a rotator cuff irritation.  Continue using ibuprofen as needed for your pain.  I have placed a referral for sports medicine per your request.  Someone should call you within the next week to get this scheduled.   If you have any questions or concerns, please do not hesitate to call the office at [phone]. You can also message me directly via MyChart.   Sincerely,  Anders Simmondshristina Chavela Justiniano, MD

## 2018-02-07 ENCOUNTER — Other Ambulatory Visit: Payer: Self-pay | Admitting: *Deleted

## 2018-02-07 MED ORDER — TRIAMCINOLONE ACETONIDE 0.5 % EX OINT: TOPICAL_OINTMENT | CUTANEOUS | 5 refills | Status: DC

## 2018-02-09 NOTE — Assessment & Plan Note (Signed)
Patient has patches of dry scaly skin throughout her entire body, worse on the extremity and less on the face.  This could be very uncontrolled eczema.   -Given the distribution of her rash and pruritus that she is experiencing I feel that it is more appropriate for her to be seen by dermatologist, referral placed -Continue daily emollient use such as Vaseline - Continue triamcinolone - Zyrtec 10 mg daily

## 2018-02-09 NOTE — Assessment & Plan Note (Addendum)
Right shoulder pain.  Right shoulder exam showing positive Hawkins test.  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 -Discussed NSAIDs and gentle stretching -Patient would prefer to be seen by sports medicine specialist at this time, referral placed

## 2018-02-12 ENCOUNTER — Ambulatory Visit (INDEPENDENT_AMBULATORY_CARE_PROVIDER_SITE_OTHER): Payer: BLUE CROSS/BLUE SHIELD | Admitting: Sports Medicine

## 2018-02-12 ENCOUNTER — Encounter: Payer: Self-pay | Admitting: Sports Medicine

## 2018-02-12 VITALS — BP 120/78 | Ht 66.0 in | Wt 171.0 lb

## 2018-02-12 DIAGNOSIS — M25511 Pain in right shoulder: Secondary | ICD-10-CM

## 2018-02-12 DIAGNOSIS — G8929 Other chronic pain: Secondary | ICD-10-CM

## 2018-02-12 DIAGNOSIS — M7541 Impingement syndrome of right shoulder: Secondary | ICD-10-CM

## 2018-02-12 MED ORDER — METHYLPREDNISOLONE ACETATE 40 MG/ML IJ SUSP
40.0000 mg | Freq: Once | INTRAMUSCULAR | Status: AC
Start: 2018-02-12 — End: 2018-02-12
  Administered 2018-02-12: 40 mg via INTRA_ARTICULAR

## 2018-02-12 MED ORDER — DICLOFENAC SODIUM 75 MG PO TBEC: 75.0000 mg | DELAYED_RELEASE_TABLET | Freq: Two times a day (BID) | ORAL | 0 refills | Status: DC | PRN

## 2018-02-12 NOTE — Addendum Note (Signed)
Addended by: Rutha BouchardBABNIK, Antionne Enrique E on: 02/12/2018 09:49 AM   Modules accepted: Orders

## 2018-02-12 NOTE — Progress Notes (Signed)
   Subjective:    Patient ID: Rebecca BreedingSakina M Leggio, female    DOB: 08/29/1971, 47 y.o.   MRN: [id number]  HPI chief complaint: Right shoulder pain  Very pleasant 47 year old female comes in today complaining of 1 month of worsening right shoulder pain. She has had pain intermittently over the past year but it has gotten worse over the past 4 weeks. She describes an aching discomfort along the lateral shoulder which is most noticeable at night when trying to sleep. Pain will radiate down to the elbow as well. Some radiating pain into her neck. No numbness or tingling. No injury that she can recall. No prior shoulder surgeries. She has been taking intermittent doses of ibuprofen without pain relief. She has not had any imaging.  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 No acute distress. Awake alert and oriented 3. Vital signs reviewed  Right shoulder: Full range of motion. Positive painful arc. No tenderness over the bicipital groove nor over the acromioclavicular joint. Positive empty can, positive Hawkins. Rotator cuff strength is 5/5. Negative O'Briens. Neurovascularly intact distally.      Assessment & Plan:   Right shoulder pain secondary to rotator cuff impingement/tendinopathy  Right subacromial space was injected with cortisone today. Patient tolerated this without difficulty. Prescription for Voltaren 75 mg to take twice daily as needed for pain. #30 with no refills. We will schedule formal physical therapy for her and she will follow-up with me in 4 weeks for reevaluation. If symptoms persist consider imaging at that time. Call with questions or concerns in the interim.  Consent obtained and verified. Time-out conducted. Noted no overlying erythema, induration, or other signs of  local infection. Skin prepped in a sterile fashion. Topical analgesic spray: Ethyl chloride. Joint: right shoulder (subacromial) Needle: 25g 1.5 inch Completed without difficulty. Meds: 3cc 1% xylocaine, 1cc (40mg ) depomedrol  Advised to call if fevers/chills, erythema, induration, drainage, or persistent bleeding.

## 2018-02-19 ENCOUNTER — Ambulatory Visit: Payer: BLUE CROSS/BLUE SHIELD | Attending: Sports Medicine

## 2018-02-19 ENCOUNTER — Other Ambulatory Visit: Payer: Self-pay

## 2018-02-19 DIAGNOSIS — M25511 Pain in right shoulder: Secondary | ICD-10-CM | POA: Insufficient documentation
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
## 2018-02-22 ENCOUNTER — Ambulatory Visit: Payer: BLUE CROSS/BLUE SHIELD

## 2018-02-22 DIAGNOSIS — M25511 Pain in right shoulder: Secondary | ICD-10-CM
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
## 2018-02-22 NOTE — Patient Instructions (Signed)
Strengthening: Isometric Flexion  Using wall for resistance, press right fist into ball using light pressure. Hold ____ seconds. Repeat ____ times per set. Do ____ sets per session. Do ____ sessions per day.  SHOULDER: Abduction (Isometric)  Use wall as resistance. Press arm against pillow. Keep elbow straight. Hold ___ seconds. ___ reps per set, ___ sets per day, ___ days per week  Extension (Isometric)  Place left bent elbow and back of arm against wall. Press elbow against wall. Hold ____ seconds. Repeat ____ times. Do ____ sessions per day.  Internal Rotation (Isometric)  Place palm of right fist against door frame, with elbow bent. Press fist against door frame. Hold ____ seconds. Repeat ____ times. Do ____ sessions per day.  External Rotation (Isometric)  Place back of left fist against door frame, with elbow bent. Press fist against door frame. Hold ____ seconds. Repeat ____ times. Do ____ sessions per day.  Copyright  VHI. All rights reserved.   All 5-10 times  5-10 sec

## 2018-02-26 ENCOUNTER — Ambulatory Visit: Payer: BLUE CROSS/BLUE SHIELD

## 2018-02-26 DIAGNOSIS — M25511 Pain in right shoulder: Secondary | ICD-10-CM
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
## 2018-02-26 NOTE — Patient Instructions (Signed)

## 2018-03-01 ENCOUNTER — Ambulatory Visit: Payer: BLUE CROSS/BLUE SHIELD | Admitting: Physical Therapy

## 2018-03-01 ENCOUNTER — Encounter: Payer: Self-pay | Admitting: Physical Therapy

## 2018-03-01 DIAGNOSIS — M25511 Pain in right shoulder: Secondary | ICD-10-CM
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
## 2018-03-01 NOTE — Patient Instructions (Signed)
Issues from Ex drawer Shoulder ER stretch Single arm Daily 1 x a day 3 x 30 seconds Gentle  painfree

## 2018-03-05 ENCOUNTER — Encounter: Payer: Self-pay | Admitting: Physical Therapy

## 2018-03-05 ENCOUNTER — Ambulatory Visit: Payer: BLUE CROSS/BLUE SHIELD | Admitting: Physical Therapy

## 2018-03-05 DIAGNOSIS — M25511 Pain in right shoulder: Secondary | ICD-10-CM
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
## 2018-03-08 ENCOUNTER — Encounter: Payer: Self-pay | Admitting: Physical Therapy

## 2018-03-08 ENCOUNTER — Ambulatory Visit: Payer: BLUE CROSS/BLUE SHIELD | Admitting: Physical Therapy

## 2018-03-08 DIAGNOSIS — M25511 Pain in right shoulder: Secondary | ICD-10-CM | POA: Diagnosis not present
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
## 2018-03-12 ENCOUNTER — Encounter: Payer: Self-pay | Admitting: Physical Therapy

## 2018-03-12 ENCOUNTER — Ambulatory Visit (INDEPENDENT_AMBULATORY_CARE_PROVIDER_SITE_OTHER): Payer: BLUE CROSS/BLUE SHIELD | Admitting: Sports Medicine

## 2018-03-12 ENCOUNTER — Ambulatory Visit: Payer: BLUE CROSS/BLUE SHIELD | Admitting: Physical Therapy

## 2018-03-12 VITALS — BP 118/72 | Ht 66.0 in | Wt 160.0 lb

## 2018-03-12 DIAGNOSIS — M7541 Impingement syndrome of right shoulder: Secondary | ICD-10-CM | POA: Diagnosis not present

## 2018-03-12 DIAGNOSIS — M25511 Pain in right shoulder: Secondary | ICD-10-CM | POA: Diagnosis not present

## 2018-03-12 NOTE — Progress Notes (Signed)
   Subjective:    Patient ID: Dallas BreedingSakina M Albano, female    DOB: 12/07/1971, 47 y.o.   MRN: [id number]  HPI   Patient comes in today for follow-up on right shoulder rotator cuff impingement. She is doing very well. Cortisone injection and physical therapy have been very helpful. She is pleased with her progress to date. No weakness. No numbness or tingling.   Review of Systems As above    Objective:   Physical Exam  Well-developed, well-nourished. No acute distress.  Right shoulder: Full painless shoulder range of motion. No tenderness to palpation over the acromioclavicular joint nor over the bicipital groove. Negative empty can, negative Hawkins. Rotator cuff strength remains 5/5 bilaterally. Neurovascularly intact distally.       Assessment & Plan:  Improving right shoulder pain secondary to rotator cuff impingement  Patient is doing well. Continue with physical therapy until she is ready to be discharged with a home exercise program. Resume activity as tolerated and follow-up with me as needed.
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
## 2018-03-12 NOTE — Patient Instructions (Signed)
Over Head Pull: Narrow Grip       On back, knees bent, feet flat, band across thighs, elbows straight but relaxed. Pull hands apart (start). Keeping elbows straight, bring arms up and over head, hands toward floor. Keep pull steady on band. Hold momentarily. Return slowly, keeping pull steady, back to start. Repeat _10 to 20__ times. Band color __Yellow____   Side Pull: Double Arm   On back, knees bent, feet flat. Arms perpendicular to body, shoulder level, elbows straight but relaxed. Pull arms out to sides, elbows straight. Resistance band comes across collarbones, hands toward floor. Hold momentarily. Slowly return to starting position. Repeat _10 to 20__ times. Band color __Yellow___   Sash   On back, knees bent, feet flat, left hand on left hip, right hand above left. Pull right arm DIAGONALLY (hip to shoulder) across chest. Bring right arm along head toward floor. Hold momentarily. Slowly return to starting position. Repeat __10 to 20_ times. Do with left arm. Band color ___Yellow___   Shoulder Rotation: Double Arm   On back, knees bent, feet flat, elbows tucked at sides, bent 90, hands palms up. Pull hands apart and down toward floor, keeping elbows near sides. Hold momentarily. Slowly return to starting position. Repeat 10 to 20___ times. Band color _Yellow_____

## 2018-03-15 ENCOUNTER — Ambulatory Visit: Payer: BLUE CROSS/BLUE SHIELD

## 2018-03-15 DIAGNOSIS — M25511 Pain in right shoulder: Secondary | ICD-10-CM | POA: Diagnosis not present
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
## 2018-03-19 ENCOUNTER — Encounter: Payer: BLUE CROSS/BLUE SHIELD | Admitting: Physical Therapy

## 2018-04-02 ENCOUNTER — Encounter: Payer: Self-pay | Admitting: Internal Medicine

## 2018-04-02 ENCOUNTER — Ambulatory Visit (INDEPENDENT_AMBULATORY_CARE_PROVIDER_SITE_OTHER): Payer: BLUE CROSS/BLUE SHIELD | Admitting: Internal Medicine

## 2018-04-02 VITALS — BP 120/70 | HR 74 | Temp 98.0°F | Ht 66.0 in | Wt 176.2 lb

## 2018-04-02 DIAGNOSIS — R21 Rash and other nonspecific skin eruption: Secondary | ICD-10-CM

## 2018-04-02 MED ORDER — RANITIDINE HCL 150 MG PO TABS: 150.0000 mg | ORAL_TABLET | Freq: Two times a day (BID) | ORAL | 1 refills | Status: DC

## 2018-04-02 MED ORDER — PREDNISONE 10 MG (21) PO TBPK: ORAL_TABLET | ORAL | 0 refills | Status: DC

## 2018-04-02 NOTE — Progress Notes (Signed)
Rebecca Blevins Progress Note  Subjective:  Rebecca BreedingSakina M Blevins is a 47 y.o. female with history of eczema and cervicalgia who presents for ongoing rash and itching. She reports ongoing symptoms for the last year but worse over the last 2 months. She was seen for this last in February and was thought to have eczema and advised to try zyrtec, continue emollient cream and triamcinolone. She was referred to dermatology given duration of symptoms and pateint preference but was waiting on insurance coverage, so referral was not completed. She reports being itchy all the time and noticing darkening and thickening of skin. She says she first noticed this along her legs but now is noticing more along the extensor surfaces of her arms and has involvement of back, abdomen and breasts. She thinks triamcinolone only helps on her face and otherwise application can cause burning sensation of skin. No one else in household with rash. ROS: No fevers, no shortness of breath  Allergies  Allergen Reactions  . Pork-Derived Products Other (See Comments)    No pork for religious reasons    Social History   Tobacco Use  . Smoking status: Never Smoker  . Smokeless tobacco: Never Used  Substance Use Topics  . Alcohol use: No    Objective: Blood pressure 120/70, pulse 74, temperature 98 F (36.7 C), temperature source Oral, height 5\' 6"  (1.676 m), weight 176 lb 3.2 oz (79.9 kg), last menstrual period 04/17/2013, SpO2 98 %. Body mass index is 28.44 kg/m. Constitutional: Pleasant female in NAD HENT: Normal oropharynx Pulmonary/Chest: Effort normal and breath sounds normal.  Skin: Diffuse large hyperpigmented patches, somewhat rough along extensor surface of arms and signs of excoriation across lower back Psychiatric: Normal mood and affect.  Vitals reviewed        Assessment/Plan: Rash and nonspecific skin eruption - Suspect diffuse eczema vs less common etiologies likek nummular dermatitis  (uncommon) vs pityriasis rosea (not as itchy).  - Recommended continuing zyrtec daily and adding zantac 150 mg BID.  - Given degree of discomfort, prescribed 6-day taper of prednisone.  - CMA Rebecca Blevins discussed referral with Rebecca Blevins, who has now processed patient's dermatology referral through new insurance.   Follow-up prn.  Rebecca GobbleHillary Liviana Mills, MD Rebecca Blevins, PGY-3

## 2018-04-02 NOTE — Patient Instructions (Signed)
Ms. Rebecca Blevins,  Please continue zyrtec daily for itching. Add zantac 150 mg twice daily.  Please start a 6-day course of steroids. You will start with 60 mg (6 tablets) and will take 1 fewer tablet each day.  Use "free and clear" detergents and gentle soaps like dove.  Best, Dr. Sampson GoonFitzgerald

## 2018-04-06 ENCOUNTER — Encounter: Payer: Self-pay | Admitting: Internal Medicine

## 2018-04-06 NOTE — Assessment & Plan Note (Signed)
-   Suspect diffuse eczema vs less common etiologies likek nummular dermatitis (uncommon) vs pityriasis rosea (not as itchy).  - Recommended continuing zyrtec daily and adding zantac 150 mg BID.  - Given degree of discomfort, prescribed 6-day taper of prednisone.  - CMA [name] discussed referral with Annice PihJackie, who has now processed patient's dermatology referral through new insurance.

## 2018-06-06 ENCOUNTER — Other Ambulatory Visit: Payer: Self-pay | Admitting: Internal Medicine

## 2018-09-17 ENCOUNTER — Encounter: Payer: Self-pay | Admitting: Family Medicine

## 2018-09-17 ENCOUNTER — Ambulatory Visit (INDEPENDENT_AMBULATORY_CARE_PROVIDER_SITE_OTHER): Payer: BLUE CROSS/BLUE SHIELD | Admitting: Family Medicine

## 2018-09-17 ENCOUNTER — Other Ambulatory Visit: Payer: Self-pay

## 2018-09-17 VITALS — BP 124/70 | HR 87 | Temp 98.2°F | Ht 66.0 in | Wt 173.0 lb

## 2018-09-17 DIAGNOSIS — R1013 Epigastric pain: Secondary | ICD-10-CM | POA: Diagnosis not present

## 2018-09-17 MED ORDER — PANTOPRAZOLE SODIUM 40 MG PO TBEC: 40.0000 mg | DELAYED_RELEASE_TABLET | Freq: Every day | ORAL | 1 refills | Status: DC
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
## 2018-09-17 NOTE — Assessment & Plan Note (Addendum)
Patient with midline epigastric abdominal pain which is worse at night and when she first wakes up in the morning.  Patient describes pain that sounds similar to GERD.  Patient has not tried anything for this.  However on exam as patient goes to lay down and sit up on exam table patient is in obvious discomfort.  Very tender to palpation in midepigastric region.  This does not fit picture with pure GERD.   -Will obtain CBC, CMP, and lipase in order to rule out other possible causes including gallbladder etiology, pancreatitis, PUD with bleeding ulcer.   -Will start patient on trial of PPI (Protonix 40 mg) daily for 1 month.  As this is not good long-term medication.  - Patient is to return if her symptoms do not resolve.   -Will call with results.

## 2018-09-17 NOTE — Patient Instructions (Addendum)
Thank you for coming to see me today. It was a pleasure! Today we talked about:   Your belly pain: I have sent pantoprazole (protonix) to your pharmacy, please take one pill daily. This should help with your belly pain. We are also getting labs to see if anything else is going on with your belly and we will call you with the results.   Please follow-up with your primary care provider in a few weeks if the pain does not go away or sooner as needed.  If you have any questions or concerns, please do not hesitate to call the office at [phone].  Take Care,   Rebecca Shontez Sermon, DO  Pantoprazole tablets What is this medicine? PANTOPRAZOLE (pan TOE pra zole) prevents the production of acid in the stomach. It is used to treat gastroesophageal reflux disease (GERD), inflammation of the esophagus, and Zollinger-Ellison syndrome. This medicine may be used for other purposes; ask your health care provider or pharmacist if you have questions. COMMON BRAND NAME(S): Protonix What should I tell my health care provider before I take this medicine? They need to know if you have any of these conditions: -liver disease -low levels of magnesium in the blood -lupus -an unusual or allergic reaction to omeprazole, lansoprazole, pantoprazole, rabeprazole, other medicines, foods, dyes, or preservatives -pregnant or trying to get pregnant -breast-feeding How should I use this medicine? Take this medicine by mouth. Swallow the tablets whole with a drink of water. Follow the directions on the prescription label. Do not crush, break, or chew. Take your medicine at regular intervals. Do not take your medicine more often than directed. Talk to your pediatrician regarding the use of this medicine in children. While this drug may be prescribed for children as young as 5 years for selected conditions, precautions do apply. Overdosage: If you think you have taken too much of this medicine contact a poison control center  or emergency room at once. NOTE: This medicine is only for you. Do not share this medicine with others. What if I miss a dose? If you miss a dose, take it as soon as you can. If it is almost time for your next dose, take only that dose. Do not take double or extra doses. What may interact with this medicine? Do not take this medicine with any of the following medications: -atazanavir -nelfinavir This medicine may also interact with the following medications: -ampicillin -delavirdine -erlotinib -iron salts -medicines for fungal infections like ketoconazole, itraconazole and voriconazole -methotrexate -mycophenolate mofetil -warfarin This list may not describe all possible interactions. Give your health care provider a list of all the medicines, herbs, non-prescription drugs, or dietary supplements you use. Also tell them if you smoke, drink alcohol, or use illegal drugs. Some items may interact with your medicine. What should I watch for while using this medicine? It can take several days before your stomach pain gets better. Check with your doctor or health care professional if your condition does not start to get better, or if it gets worse. You may need blood work done while you are taking this medicine. What side effects may I notice from receiving this medicine? Side effects that you should report to your doctor or health care professional as soon as possible: -allergic reactions like skin rash, itching or hives, swelling of the face, lips, or tongue -bone, muscle or joint pain -breathing problems -chest pain or chest tightness -dark yellow or brown urine -dizziness -fast, irregular heartbeat -feeling faint or lightheaded -fever  or sore throat -muscle spasm -palpitations -rash on cheeks or arms that gets worse in the sun -redness, blistering, peeling or loosening of the skin, including inside the mouth -seizures -tremors -unusual bleeding or bruising -unusually weak or  tired -yellowing of the eyes or skin Side effects that usually do not require medical attention (report to your doctor or health care professional if they continue or are bothersome): -constipation -diarrhea -dry mouth -headache -nausea This list may not describe all possible side effects. Call your doctor for medical advice about side effects. You may report side effects to FDA at 1-800-FDA-1088. Where should I keep my medicine? Keep out of the reach of children. Store at room temperature between 15 and 30 degrees C (59 and 86 degrees F). Protect from light and moisture. Throw away any unused medicine after the expiration date. NOTE: This sheet is a summary. It may not cover all possible information. If you have questions about this medicine, talk to your doctor, pharmacist, or health care provider.  2018 Elsevier/Gold Standard (2016-01-07 12:20:19)

## 2018-09-18 LAB — CBC
HEMATOCRIT: 38.9 % (ref 34.0–46.6)
HEMOGLOBIN: 13.1 g/dL (ref 11.1–15.9)
MCH: 30 pg (ref 26.6–33.0)
MCHC: 33.7 g/dL (ref 31.5–35.7)
MCV: 89 fL (ref 79–97)
Platelets: 363 10*3/uL (ref 150–450)
RBC: 4.36 x10E6/uL (ref 3.77–5.28)
RDW: 13.9 % (ref 12.3–15.4)
WBC: 4 10*3/uL (ref 3.4–10.8)

## 2018-09-18 LAB — COMPREHENSIVE METABOLIC PANEL
ALBUMIN: 4.7 g/dL (ref 3.5–5.5)
ALK PHOS: 84 IU/L (ref 39–117)
ALT: 12 IU/L (ref 0–32)
AST: 12 IU/L (ref 0–40)
Albumin/Globulin Ratio: 1.7 (ref 1.2–2.2)
BUN / CREAT RATIO: 14 (ref 9–23)
BUN: 10 mg/dL (ref 6–24)
Bilirubin Total: 0.2 mg/dL (ref 0.0–1.2)
CALCIUM: 9.7 mg/dL (ref 8.7–10.2)
CO2: 25 mmol/L (ref 20–29)
CREATININE: 0.69 mg/dL (ref 0.57–1.00)
Chloride: 101 mmol/L (ref 96–106)
GFR, EST AFRICAN AMERICAN: 120 mL/min/{1.73_m2} (ref >59)
GFR, EST NON AFRICAN AMERICAN: 104 mL/min/{1.73_m2} (ref >59)
Globulin, Total: 2.7 g/dL (ref 1.5–4.5)
Glucose: 97 mg/dL (ref 65–99)
Potassium: 4.2 mmol/L (ref 3.5–5.2)
Sodium: 142 mmol/L (ref 134–144)
TOTAL PROTEIN: 7.4 g/dL (ref 6.0–8.5)

## 2018-09-18 LAB — LIPASE: LIPASE: 41 U/L (ref 14–72)

## 2018-09-19 ENCOUNTER — Telehealth: Payer: Self-pay | Admitting: Family Medicine

## 2018-09-19 NOTE — Telephone Encounter (Signed)
Called patient and informed her of normal lab results from 9/30.  Patient reporting that she is taking the Protonix twice daily in order to help her with her pain.  She reports that she is actually feeling better today.  She has an appointment with Dr. Karen Chafe on 10/18.

## 2018-10-05 ENCOUNTER — Encounter: Payer: Self-pay | Admitting: Family Medicine

## 2018-10-05 ENCOUNTER — Ambulatory Visit (INDEPENDENT_AMBULATORY_CARE_PROVIDER_SITE_OTHER): Payer: BLUE CROSS/BLUE SHIELD | Admitting: Family Medicine

## 2018-10-05 ENCOUNTER — Other Ambulatory Visit: Payer: Self-pay

## 2018-10-05 DIAGNOSIS — K219 Gastro-esophageal reflux disease without esophagitis: Secondary | ICD-10-CM

## 2018-10-05 MED ORDER — PANTOPRAZOLE SODIUM 20 MG PO TBEC: 20.0000 mg | DELAYED_RELEASE_TABLET | Freq: Every day | ORAL | 2 refills | Status: DC

## 2018-10-05 NOTE — Progress Notes (Signed)
     Subjective: Chief Complaint  Patient presents with  . f/u abdominal pain     HPI: Rebecca Blevins is a 47 y.o. presenting to clinic today to discuss the following:  Follow up for Abdominal Pain Patient was seen by Dr. Talbert Forest in our clinic on 9/30 with concern for abdominal pain. Lab work was within normal CBC, CMP, and Lipase. She was started on Protonix 40mg  for one month. She has been compliant with taking Protonix and she still has some epigastric and stomach pain but it is much improved since her last visit.  She denies fever, chills, nausea, vomiting, diarrhea, burning or pain with urination, blood in her urine or stool.  Health Maintenance: TDap reported as received, not verified.     ROS noted in HPI.   Past Medical, Surgical, Social, and Family History Reviewed & Updated per EMR.   Pertinent Historical Findings include:   Social History   Tobacco Use  Smoking Status Never Smoker  Smokeless Tobacco Never Used    Objective: Ht 5\' 6"  (1.676 m)   Wt 169 lb (76.7 kg)   LMP 04/17/2013   BMI 27.28 kg/m  Vitals and nursing notes reviewed  Physical Exam Gen: Alert and Oriented x 3, NAD HEENT: Normocephalic, atraumatic CV: RRR, no murmurs, normal S1, S2 split Resp: CTAB, no wheezing, rales, or rhonchi, comfortable work of breathing Abd: non-distended, tender to palpation in the epigastric area, Negative Murphy sign, soft, +bs in all four quadrants Ext: no clubbing, cyanosis, or edema Skin: warm, dry, intact, no rashes  No results found for this or any previous visit (from the past 72 hour(s)).  Assessment/Plan:  GERD (gastroesophageal reflux disease) Patient responding well to Protonix 40mg . I will have her complete a 4 week course at 40mg  and then I have sent a prescription for her to go to 20mg  daily.  I will see her back in 1 month to ensure the pain continues to improve. If she is doing better and has complete resolution of symptoms at that time I will  stop her PPI briefly to perform H. Pylori testing to ensure that is not causing her abdominal pain.   PATIENT EDUCATION PROVIDED: See AVS    Diagnosis and plan along with any newly prescribed medication(s) were discussed in detail with this patient today. The patient verbalized understanding and agreed with the plan. Patient advised if symptoms worsen return to clinic or ER.   Health Maintainance:   No orders of the defined types were placed in this encounter.   Meds ordered this encounter  Medications  . pantoprazole (PROTONIX) 20 MG tablet    Sig: Take 1 tablet (20 mg total) by mouth daily.    Dispense:  30 tablet    Refill:  2   Jules Schick, DO 10/05/2018, 11:10 AM PGY-2 [facility]

## 2018-10-05 NOTE — Patient Instructions (Addendum)
It was great to meet you today! Thank you for letting me participate in your care!  Today, we discussed your GERD and the improvement you have had on Protonix. Please finish what you have and then start taking 20mg  of protonix. I am sending that to the pharmacy today.   Gastroesophageal Reflux Disease, Adult Normally, food travels down the esophagus and stays in the stomach to be digested. If a person has gastroesophageal reflux disease (GERD), food and stomach acid move back up into the esophagus. When this happens, the esophagus becomes sore and swollen (inflamed). Over time, GERD can make small holes (ulcers) in the lining of the esophagus. Follow these instructions at home: Diet  Follow a diet as told by your doctor. You may need to avoid foods and drinks such as: ? Coffee and tea (with or without caffeine). ? Drinks that contain alcohol. ? Energy drinks and sports drinks. ? Carbonated drinks or sodas. ? Chocolate and cocoa. ? Peppermint and mint flavorings. ? Garlic and onions. ? Horseradish. ? Spicy and acidic foods, such as peppers, chili powder, curry powder, vinegar, hot sauces, and BBQ sauce. ? Citrus fruit juices and citrus fruits, such as oranges, lemons, and limes. ? Tomato-based foods, such as red sauce, chili, salsa, and pizza with red sauce. ? Fried and fatty foods, such as donuts, french fries, potato chips, and high-fat dressings. ? High-fat meats, such as hot dogs, rib eye steak, sausage, ham, and bacon. ? High-fat dairy items, such as whole milk, butter, and cream cheese.  Eat small meals often. Avoid eating large meals.  Avoid drinking large amounts of liquid with your meals.  Avoid eating meals during the 2-3 hours before bedtime.  Avoid lying down right after you eat.  Do not exercise right after you eat. General instructions  Pay attention to any changes in your symptoms.  Take over-the-counter and prescription medicines only as told by your doctor. Do  not take aspirin, ibuprofen, or other NSAIDs unless your doctor says it is okay.  Do not use any tobacco products, including cigarettes, chewing tobacco, and e-cigarettes. If you need help quitting, ask your doctor.  Wear loose clothes. Do not wear anything tight around your waist.  Raise (elevate) the head of your bed about 6 inches (15 cm).  Try to lower your stress. If you need help doing this, ask your doctor.  If you are overweight, lose an amount of weight that is healthy for you. Ask your doctor about a safe weight loss goal.  Keep all follow-up visits as told by your doctor. This is important. Contact a doctor if:  You have new symptoms.  You lose weight and you do not know why it is happening.  You have trouble swallowing, or it hurts to swallow.  You have wheezing or a cough that keeps happening.  Your symptoms do not get better with treatment.  You have a hoarse voice. Get help right away if:  You have pain in your arms, neck, jaw, teeth, or back.  You feel sweaty, dizzy, or light-headed.  You have chest pain or shortness of breath.  You throw up (vomit) and your throw up looks like blood or coffee grounds.  You pass out (faint).  Your poop (stool) is bloody or black.  You cannot swallow, drink, or eat. This information is not intended to replace advice given to you by your health care provider. Make sure you discuss any questions you have with your health care provider. Document Released:  05/23/2008 Document Revised: 05/12/2016 Document Reviewed: 04/01/2015 Elsevier Interactive Patient Education  2018 [employer].   Be well, Jules Schick, DO PGY-2, [facility]

## 2018-10-09 ENCOUNTER — Other Ambulatory Visit: Payer: Self-pay | Admitting: Family Medicine

## 2018-10-11 DIAGNOSIS — K219 Gastro-esophageal reflux disease without esophagitis: Secondary | ICD-10-CM | POA: Insufficient documentation

## 2018-10-11 NOTE — Assessment & Plan Note (Signed)
Patient responding well to Protonix 40mg . I will have her complete a 4 week course at 40mg  and then I have sent a prescription for her to go to 20mg  daily.  I will see her back in 1 month to ensure the pain continues to improve. If she is doing better and has complete resolution of symptoms at that time I will stop her PPI briefly to perform H. Pylori testing to ensure that is not causing her abdominal pain.

## 2018-11-01 ENCOUNTER — Other Ambulatory Visit: Payer: Self-pay | Admitting: Family Medicine

## 2018-11-06 ENCOUNTER — Encounter: Payer: Self-pay | Admitting: Family Medicine

## 2018-11-06 ENCOUNTER — Ambulatory Visit (HOSPITAL_COMMUNITY)
Admission: RE | Admit: 2018-11-06 | Discharge: 2018-11-06 | Disposition: A | Discharge status: Home / Self Care | Payer: BLUE CROSS/BLUE SHIELD | Source: Ambulatory Visit | Attending: Family Medicine | Admitting: Family Medicine

## 2018-11-06 ENCOUNTER — Other Ambulatory Visit: Payer: Self-pay

## 2018-11-06 ENCOUNTER — Ambulatory Visit (INDEPENDENT_AMBULATORY_CARE_PROVIDER_SITE_OTHER): Payer: BLUE CROSS/BLUE SHIELD | Admitting: Family Medicine

## 2018-11-06 VITALS — BP 112/68 | HR 57 | Temp 97.7°F | Ht 66.0 in | Wt 167.2 lb

## 2018-11-06 DIAGNOSIS — R0789 Other chest pain: Secondary | ICD-10-CM | POA: Diagnosis present

## 2018-11-06 DIAGNOSIS — K219 Gastro-esophageal reflux disease without esophagitis: Secondary | ICD-10-CM | POA: Diagnosis not present

## 2018-11-06 LAB — POCT GLYCOSYLATED HEMOGLOBIN (HGB A1C): Hemoglobin A1C: 6.1 % — AB (ref 4.0–5.6)

## 2018-11-06 NOTE — Patient Instructions (Signed)
It was great to see you again today! Thank you for letting me participate in your care!  Today, we discussed your chest pain and from your EKG I am reassured you are not having any active heart issue at this time. However, I think it is reasonable to get a work up performed to rule out the heart as a cause.  I got some initial labs and will notify you if anything is abnormal I am also referring you to Cardiology to see if they believe you should have additional work up such as an exercise stress test.  Be well, Rebecca Schickim [name], DO PGY-2, [facility]

## 2018-11-06 NOTE — Progress Notes (Signed)
Subjective: Chief Complaint  Patient presents with  . Follow-up    GERD   HPI: Rebecca Blevins a 47 y.o. presenting to clinic today to discuss the following:  Chest Pain Patient states for the past 3 days she has felt "heaviness" in her chest that Blevins intermittent lasting about 10 minutes per episodes occurring daily. It Blevins not associated with activity. It Blevins located in the midsternal area and radiates to the left side of her chest. It Blevins not associated with eating or laying down. It does tend to get better with ice. It does not hurt when she breaths, Blevins not tender to touch, and she has had no associated symptoms such as diaphoresis, SOB, nausea, vomiting fever, chills. She denies any family history of heart disease and does not smoke. She has not had a recent work up for any heart issue.  Of note, patient has recently been treated for heartburn and states that has been better and "this pain Blevins different from what was going on before".  GERD Patient has finished her two weeks of 40mg  of pantoprazole and has been taking 20mg  daily. She reports her heartburn has gotten better and her symptoms are now well controlled on 20mg  daily. No more abdominal or epigastric pain.  ROS noted in HPI.   Past Medical, Surgical, Social, and Family History Reviewed & Updated per EMR.   Pertinent Historical Findings include:   Social History   Tobacco Use  Smoking Status Never Smoker  Smokeless Tobacco Never Used   Objective: BP 112/68   Pulse (!) 57   Temp 97.7 F (36.5 C) (Oral)   Ht 5\' 6"  (1.676 m)   Wt 167 lb 3.2 oz (75.8 kg)   LMP 04/17/2013   SpO2 99%   BMI 26.99 kg/m  Vitals and nursing notes reviewed  Physical Exam Gen: Alert and Oriented x 3, NAD HEENT: Normocephalic, atraumatic CV: RRR, no murmurs, normal S1, S2 split, chest Blevins Non-TTP Resp: CTAB, no wheezing, rales, or rhonchi, comfortable work of breathing Abd: non-distended, non-tender, soft, +bs in all four  quadrants Ext: no clubbing, cyanosis, or edema Skin: warm, dry, intact, no rashes  LABS EKG - NSR, bradycardia, possible old inferior infarct  CBC    Component Value Date/Time   WBC 4.6 11/06/2018 1455   WBC 15.0 (H) 08/27/2016 2330   RBC 4.50 11/06/2018 1455   RBC 4.77 08/27/2016 2330   HGB 13.1 11/06/2018 1455   HCT 38.5 11/06/2018 1455   PLT 417 11/06/2018 1455   MCV 86 11/06/2018 1455   MCH 29.1 11/06/2018 1455   MCH 29.1 08/27/2016 2330   MCHC 34.0 11/06/2018 1455   MCHC 33.6 08/27/2016 2330   RDW 13.1 11/06/2018 1455   LYMPHSABS 1.2 04/01/2008 2024   MONOABS 0.5 04/01/2008 2024   EOSABS 0.2 04/01/2008 2024   BASOSABS 0.1 04/01/2008 2024   BMET    Component Value Date/Time   NA 141 11/06/2018 1455   K 4.4 11/06/2018 1455   CL 102 11/06/2018 1455   CO2 25 11/06/2018 1455   GLUCOSE 90 11/06/2018 1455   GLUCOSE 103 (H) 10/06/2016 1109   BUN 7 11/06/2018 1455   CREATININE 0.71 11/06/2018 1455   CREATININE 0.75 10/06/2016 1109   CALCIUM 9.8 11/06/2018 1455   GFRNONAA 102 11/06/2018 1455   GFRNONAA >89 10/06/2016 1109   GFRAA 117 11/06/2018 1455   GFRAA >89 10/06/2016 1109   Lipid Panel     Component Value Date/Time  CHOL 117 11/06/2018 1455   TRIG 74 11/06/2018 1455   HDL 50 11/06/2018 1455   CHOLHDL 2.3 11/06/2018 1455   CHOLHDL 2.3 Ratio 02/11/2009 2051   VLDL 16 02/11/2009 2051   LDLCALC 52 11/06/2018 1455   TSH 0.739  HgbA1c 6.1  Assessment/Plan:  GERD (gastroesophageal reflux disease) Well controlled after finishing her 4 weeks on 40mg  Protonix. Now on 20mg  daily and well controlled. - Cont on 20mg  PPI for now - If symptoms return, stop PPI, test for H. Pylori  Other chest pain Unclear etiology but considered costochondritis vs cardiac vs heartburn vs acute MSK sprain of pec major.  I don't believe this Blevins heartburn as this pain Blevins different in timing, quality than before. The fact that it responds to ice somewhat makes me think this  could be costochondritis but it Blevins not tender to palpation and not constant making that a less likely diagnosis.   Given her age and lack of known risk factors I do not believe this cardiac in nature. EKG performed in office showed NSR with no ST segment changes or abnormalities. Mild bradycardia. Even though this Blevins less likely to be cardiac I do believe it Blevins reasonable given her symptoms to have work up and send to cardiology for a exercise stress test. - Lipid, TSH, CBC, BMET to check for any contributing factors. - Refer to cardiology to evaluate for exercise stress test to rule out cardiac cause  PATIENT EDUCATION PROVIDED: See AVS    Diagnosis and plan along with any newly prescribed medication(s) were discussed in detail with this patient today. The patient verbalized understanding and agreed with the plan. Patient advised if symptoms worsen return to clinic or ER.   Health Maintainance: None   Orders Placed This Encounter  Procedures  . Ambulatory referral to Cardiology    Referral Priority:   Routine    Referral Type:   Consultation    Referral Reason:   Specialty Services Required    Requested Specialty:   Cardiology    Number of Visits Requested:   1  . EKG 12-Lead    No orders of the defined types were placed in this encounter.    Jules Schick, DO 11/06/2018, 2:31 PM PGY-2 [facility]

## 2018-11-07 LAB — BASIC METABOLIC PANEL
BUN / CREAT RATIO: 10 (ref 9–23)
BUN: 7 mg/dL (ref 6–24)
CHLORIDE: 102 mmol/L (ref 96–106)
CO2: 25 mmol/L (ref 20–29)
Calcium: 9.8 mg/dL (ref 8.7–10.2)
Creatinine, Ser: 0.71 mg/dL (ref 0.57–1.00)
GFR calc non Af Amer: 102 mL/min/{1.73_m2} (ref >59)
GFR, EST AFRICAN AMERICAN: 117 mL/min/{1.73_m2} (ref >59)
Glucose: 90 mg/dL (ref 65–99)
Potassium: 4.4 mmol/L (ref 3.5–5.2)
Sodium: 141 mmol/L (ref 134–144)

## 2018-11-07 LAB — CBC
Hematocrit: 38.5 % (ref 34.0–46.6)
Hemoglobin: 13.1 g/dL (ref 11.1–15.9)
MCH: 29.1 pg (ref 26.6–33.0)
MCHC: 34 g/dL (ref 31.5–35.7)
MCV: 86 fL (ref 79–97)
Platelets: 417 10*3/uL (ref 150–450)
RBC: 4.5 x10E6/uL (ref 3.77–5.28)
RDW: 13.1 % (ref 12.3–15.4)
WBC: 4.6 10*3/uL (ref 3.4–10.8)

## 2018-11-07 LAB — LIPID PANEL
CHOLESTEROL TOTAL: 117 mg/dL (ref 100–199)
Chol/HDL Ratio: 2.3 ratio (ref 0.0–4.4)
HDL: 50 mg/dL (ref >39)
LDL CALC: 52 mg/dL (ref 0–99)
TRIGLYCERIDES: 74 mg/dL (ref 0–149)
VLDL CHOLESTEROL CAL: 15 mg/dL (ref 5–40)

## 2018-11-07 LAB — TSH: TSH: 0.739 u[IU]/mL (ref 0.450–4.500)

## 2018-11-09 NOTE — Assessment & Plan Note (Signed)
Well controlled after finishing her 4 weeks on 40mg  Protonix. Now on 20mg  daily and well controlled. - Cont on 20mg  PPI for now - If symptoms return, stop PPI, test for H. Pylori

## 2018-11-09 NOTE — Assessment & Plan Note (Signed)
Unclear etiology but considered costochondritis vs cardiac vs heartburn vs acute MSK sprain of pec major.  I don't believe this is heartburn as this pain is different in timing, quality than before. The fact that it responds to ice somewhat makes me think this could be costochondritis but it is not tender to palpation and not constant making that a less likely diagnosis.   Given her age and lack of known risk factors I do not believe this cardiac in nature. EKG performed in office showed NSR with no ST segment changes or abnormalities. Mild bradycardia. Even though this is less likely to be cardiac I do believe it is reasonable given her symptoms to have work up and send to cardiology for a exercise stress test. - Lipid, TSH, CBC, BMET to check for any contributing factors. - Refer to cardiology to evaluate for exercise stress test to rule out cardiac cause

## 2019-01-30 ENCOUNTER — Other Ambulatory Visit: Payer: Self-pay | Admitting: Family Medicine

## 2019-01-31 ENCOUNTER — Ambulatory Visit
Admission: RE | Admit: 2019-01-31 | Discharge: 2019-01-31 | Disposition: A | Discharge status: Home / Self Care | Payer: BLUE CROSS/BLUE SHIELD | Source: Ambulatory Visit | Attending: Interventional Cardiology | Admitting: Interventional Cardiology

## 2019-01-31 ENCOUNTER — Encounter (INDEPENDENT_AMBULATORY_CARE_PROVIDER_SITE_OTHER): Payer: Self-pay

## 2019-01-31 ENCOUNTER — Encounter: Payer: Self-pay | Admitting: Interventional Cardiology

## 2019-01-31 ENCOUNTER — Ambulatory Visit: Payer: BLUE CROSS/BLUE SHIELD | Admitting: Interventional Cardiology

## 2019-01-31 VITALS — BP 112/72 | HR 65 | Ht 66.0 in | Wt 163.2 lb

## 2019-01-31 DIAGNOSIS — R0789 Other chest pain: Secondary | ICD-10-CM | POA: Diagnosis not present

## 2019-01-31 NOTE — Patient Instructions (Signed)
Medication Instructions:  Your physician recommends that you continue on your current medications as directed. Please refer to the Current Medication list given to you today.  If you need a refill on your cardiac medications before your next appointment, please call your pharmacy.   Lab work: None If you have labs (blood work) drawn today and your tests are completely normal, you will receive your results only by: Marland Kitchen MyChart Message (if you have MyChart) OR . A paper copy in the mail If you have any lab test that is abnormal or we need to change your treatment, we will call you to review the results.  Testing/Procedures: A chest x-ray takes a picture of the organs and structures inside the chest, including the heart, lungs, and blood vessels. This test can show several things, including, whether the heart is enlarges; whether fluid is building up in the lungs; and whether pacemaker / defibrillator leads are still in place. Please go to [facility] at [address] to have this completed.  Follow-Up: Your physician recommends that you schedule a follow-up appointment as needed with Dr. Katrinka Blazing.      Any Other Special Instructions Will Be Listed Below (If Applicable).

## 2019-01-31 NOTE — Progress Notes (Signed)
Cardiology Office Note:    Date:  01/31/2019   ID:  YAKELYN FITZER, DOB 07-30-1971, MRN [id number]  PCP:  Arlyce Harman, DO  Cardiologist:  No primary care provider on file.   Referring MD: Arlyce Harman, DO   Chief Complaint  Patient presents with  . Chest Pain    History of Present Illness:    Rebecca Blevins is a 48 y.o. female with a hx of chest pain referred by Dr. Karen Chafe for consultation and diagnosis.  The patient has a 16-month history of burning discomfort in the left subclavicular area that radiates down the mid clavicular line into the chest.  The discomfort is described as a burning sensation.  It is not precipitated by physical activity.  It can increase and decrease in intensity.  There are no particular precipitants.  The discomfort can last hours but generally lasts minutes and a waxing and waning pattern.  There is no associated shortness of breath.  There are no palpitations.  She has not had syncope.  No history of heart disease or heart murmur.  No particular evaluation has been done by her primary physician.  She is not diabetic, does not smoke, has no family history of heart disease, denies hypertension, and has no difficulty with sleep.  History reviewed. No pertinent past medical history.  Past Surgical History:  Procedure Laterality Date  . UVULECTOMY     as a child in [place]    Current Medications: Current Meds  Medication Sig  . cetirizine (ZYRTEC) 10 MG tablet Take 1 tablet (10 mg total) by mouth daily.  . diclofenac (VOLTAREN) 75 MG EC tablet Take 1 tablet (75 mg total) by mouth 2 (two) times daily as needed. Take with food.  . pantoprazole (PROTONIX) 20 MG tablet Take 1 tablet (20 mg total) by mouth daily.  . ranitidine (ZANTAC) 150 MG tablet TAKE 1 TABLET BY MOUTH TWICE A DAY  . triamcinolone ointment (KENALOG) 0.5 % APPLY 1 APPLICATION TOPICALLY 2 (TWO) TIMES DAILY.     Allergies:   Pork-derived products   Social History   Socioeconomic  History  . Marital status: Married    Spouse name: Not on file  . Number of children: Not on file  . Years of education: Not on file  . Highest education level: Not on file  Occupational History  . Not on file  Social Needs  . Financial resource strain: Not on file  . Food insecurity:    Worry: Not on file    Inability: Not on file  . Transportation needs:    Medical: Not on file    Non-medical: Not on file  Tobacco Use  . Smoking status: Never Smoker  . Smokeless tobacco: Never Used  Substance and Sexual Activity  . Alcohol use: No  . Drug use: No  . Sexual activity: Not on file  Lifestyle  . Physical activity:    Days per week: Not on file    Minutes per session: Not on file  . Stress: Not on file  Relationships  . Social connections:    Talks on phone: Not on file    Gets together: Not on file    Attends religious service: Not on file    Active member of club or organization: Not on file    Attends meetings of clubs or organizations: Not on file    Relationship status: Not on file  Other Topics Concern  . Not on file  Social History Narrative  Housecleaner.  Born [place]       Family History: The patient's family history is not on file.  ROS:   Please see the history of present illness.    She works extremely hard.  She is under stress.  He has never been a smoker.  All other systems reviewed and are negative.  EKGs/Labs/Other Studies Reviewed:    The following studies were reviewed today:  Total cholesterol 117, LDL 52   EKG:  EKG is bradycardia less than 60 bpm with relatively short PR interval.  PR 122 ms.  Otherwise normal.  Study was performed on 11/06/2018.  Recent Labs: 09/17/2018: ALT 12 11/06/2018: BUN 7; Creatinine, Ser 0.71; Hemoglobin 13.1; Platelets 417; Potassium 4.4; Sodium 141; TSH 0.739  Recent Lipid Panel    Component Value Date/Time   CHOL 117 11/06/2018 1455   TRIG 74 11/06/2018 1455   HDL 50 11/06/2018 1455   CHOLHDL 2.3  11/06/2018 1455   CHOLHDL 2.3 Ratio 02/11/2009 2051   VLDL 16 02/11/2009 2051   LDLCALC 52 11/06/2018 1455    Physical Exam:    VS:  BP 112/72   Pulse 65   Ht 5\' 6"  (1.676 m)   Wt 163 lb 3.2 oz (74 kg)   LMP 04/17/2013   SpO2 98%   BMI 26.34 kg/m     Wt Readings from Last 3 Encounters:  01/31/19 163 lb 3.2 oz (74 kg)  11/06/18 167 lb 3.2 oz (75.8 kg)  10/05/18 169 lb (76.7 kg)     GEN: Appears younger than stated age. No acute distress HEENT: Normal NECK: No JVD. LYMPHATICS: No lymphadenopathy CARDIAC: RRR.  No murmur, no gallop, no edema VASCULAR: 2+ bilateral radial and carotid pulses, no bruits RESPIRATORY:  Clear to auscultation without rales, wheezing or rhonchi  ABDOMEN: Soft, non-tender, non-distended, No pulsatile mass, MUSCULOSKELETAL: No deformity  SKIN: Warm and dry NEUROLOGIC:  Alert and oriented x 3 PSYCHIATRIC:  Normal affect   ASSESSMENT:    1. Other chest pain    PLAN:    In order of problems listed above:  1. The chest pain is very atypical and I do not believe it has a cardiac basis.  EKG is normal as is auscultation.  She has no cardiac limitations.  The pain has qualities that are more suggestive of neuropathic or musculoskeletal etiology.  We will perform a PA and lateral chest x-ray to rule out obvious structural abnormality.  In absence of clinical findings I do not believe an echocardiogram or stress test will be helpful.   Medication Adjustments/Labs and Tests Ordered: Current medicines are reviewed at length with the patient today.  Concerns regarding medicines are outlined above.  Orders Placed This Encounter  Procedures  . DG Chest 2 View   No orders of the defined types were placed in this encounter.   There are no Patient Instructions on file for this visit.   Signed, Lesleigh NoeHenry W Dquan Cortopassi III, MD  01/31/2019 2:57 PM    [facility]

## 2019-02-13 ENCOUNTER — Other Ambulatory Visit: Payer: Self-pay | Admitting: Family Medicine

## 2019-02-15 ENCOUNTER — Ambulatory Visit: Payer: BLUE CROSS/BLUE SHIELD | Admitting: Family Medicine

## 2019-02-18 MED ORDER — RANITIDINE HCL 150 MG PO TABS: 150.0000 mg | ORAL_TABLET | Freq: Two times a day (BID) | ORAL | 1 refills | Status: DC

## 2019-02-18 NOTE — Telephone Encounter (Signed)
First transmission failed.  Resent to Pharmacy. Fleeger, Maryjo Rochester, CMA

## 2019-02-18 NOTE — Addendum Note (Signed)
Addended by: Jone Baseman D on: 02/18/2019 10:57 AM   Modules accepted: Orders

## 2020-01-27 ENCOUNTER — Encounter: Payer: Self-pay | Admitting: Family Medicine

## 2020-01-27 ENCOUNTER — Other Ambulatory Visit: Payer: Self-pay

## 2020-01-27 ENCOUNTER — Ambulatory Visit (INDEPENDENT_AMBULATORY_CARE_PROVIDER_SITE_OTHER): Payer: 59 | Admitting: Family Medicine

## 2020-01-27 VITALS — BP 96/52 | HR 75 | Wt 175.6 lb

## 2020-01-27 DIAGNOSIS — M2550 Pain in unspecified joint: Secondary | ICD-10-CM | POA: Diagnosis not present

## 2020-01-27 DIAGNOSIS — Z23 Encounter for immunization: Secondary | ICD-10-CM | POA: Diagnosis not present

## 2020-01-27 MED ORDER — FAMOTIDINE 10 MG PO TABS: 10.0000 mg | ORAL_TABLET | Freq: Two times a day (BID) | ORAL | 2 refills | Status: DC

## 2020-01-27 MED ORDER — AMITRIPTYLINE HCL 10 MG PO TABS: 10.0000 mg | ORAL_TABLET | Freq: Every day | ORAL | 1 refills | Status: DC

## 2020-01-27 MED ORDER — PANTOPRAZOLE SODIUM 20 MG PO TBEC: 20.0000 mg | DELAYED_RELEASE_TABLET | Freq: Every day | ORAL | 2 refills | Status: DC

## 2020-01-27 NOTE — Patient Instructions (Addendum)
It was great to see you today! Thank you for letting me participate in your care!  Today, we discussed your heartburn and without medications it will continue to cause pain. Please pick up and restart taking your medications Pepcid and Protonix.   I also think you have Fibromyalgia. I have started a medication that will help but the best treatment is consistent exercise.   Myofascial Pain Syndrome and Fibromyalgia Myofascial pain syndrome and fibromyalgia are both pain disorders. This pain may be felt mainly in your muscles.  Myofascial pain syndrome: ? Always has tender points in the muscle that will cause pain when pressed (trigger points). The pain may come and go. ? Usually affects your neck, upper back, and shoulder areas. The pain often radiates into your arms and hands.  Fibromyalgia: ? Has muscle pains and tenderness that come and go. ? Is often associated with fatigue and sleep problems. ? Has trigger points. ? Tends to be long-lasting (chronic), but is not life-threatening. Fibromyalgia and myofascial pain syndrome are not the same. However, they often occur together. If you have both conditions, each can make the other worse. Both are common and can cause enough pain and fatigue to make day-to-day activities difficult. Both can be hard to diagnose because their symptoms are common in many other conditions. What are the causes? The exact causes of these conditions are not known. What increases the risk? You are more likely to develop this condition if:  You have a family history of the condition.  You have certain triggers, such as: ? Spine disorders. ? An injury (trauma) or other physical stressors. ? Being under a lot of stress. ? Medical conditions such as osteoarthritis, rheumatoid arthritis, or lupus. What are the signs or symptoms? Fibromyalgia The main symptom of fibromyalgia is widespread pain and tenderness in your muscles. Pain is sometimes described as stabbing,  shooting, or burning. You may also have:  Tingling or numbness.  Sleep problems and fatigue.  Problems with attention and concentration (fibro fog). Other symptoms may include:  Bowel and bladder problems.  Headaches.  Visual problems.  Problems with odors and noises.  Depression or mood changes.  Painful menstrual periods (dysmenorrhea).  Dry skin or eyes. These symptoms can vary over time. Myofascial pain syndrome Symptoms of myofascial pain syndrome include:  Tight, ropy bands of muscle.  Uncomfortable sensations in muscle areas. These may include aching, cramping, burning, numbness, tingling, and weakness.  Difficulty moving certain parts of the body freely (poor range of motion). How is this diagnosed? This condition may be diagnosed by your symptoms and medical history. You will also have a physical exam. In general:  Fibromyalgia is diagnosed if you have pain, fatigue, and other symptoms for more than 3 months, and symptoms cannot be explained by another condition.  Myofascial pain syndrome is diagnosed if you have trigger points in your muscles, and those trigger points are tender and cause pain elsewhere in your body (referred pain). How is this treated? Treatment for these conditions depends on the type that you have.  For fibromyalgia: ? Pain medicines, such as NSAIDs. ? Medicines for treating depression. ? Medicines for treating seizures. ? Medicines that relax the muscles.  For myofascial pain: ? Pain medicines, such as NSAIDs. ? Cooling and stretching of muscles. ? Trigger point injections. ? Sound wave (ultrasound) treatments to stimulate muscles. Treating these conditions often requires a team of health care providers. These may include:  Your primary care provider.  Physical therapist.  Complementary health care providers, such as massage therapists or acupuncturists.  Psychiatrist for cognitive behavioral therapy. Follow these  instructions at home: Medicines  Take over-the-counter and prescription medicines only as told by your health care provider.  Do not drive or use heavy machinery while taking prescription pain medicine.  If you are taking prescription pain medicine, take actions to prevent or treat constipation. Your health care provider may recommend that you: ? Drink enough fluid to keep your urine pale yellow. ? Eat foods that are high in fiber, such as fresh fruits and vegetables, whole grains, and beans. ? Limit foods that are high in fat and processed sugars, such as fried or sweet foods. ? Take an over-the-counter or prescription medicine for constipation. Lifestyle   Exercise as directed by your health care provider or physical therapist.  Practice relaxation techniques to control your stress. You may want to try: ? Biofeedback. ? Visual imagery. ? Hypnosis. ? Muscle relaxation. ? Yoga. ? Meditation.  Maintain a healthy lifestyle. This includes eating a healthy diet and getting enough sleep.  Do not use any products that contain nicotine or tobacco, such as cigarettes and e-cigarettes. If you need help quitting, ask your health care provider. General instructions  Talk to your health care provider about complementary treatments, such as acupuncture or massage.  Consider joining a support group with others who are diagnosed with this condition.  Do not do activities that stress or strain your muscles. This includes repetitive motions and heavy lifting.  Keep all follow-up visits as told by your health care provider. This is important. Where to find more information  National Fibromyalgia Association: www.fmaware.org  Arthritis Foundation: www.arthritis.org  American Chronic Pain Association: www.theacpa.org Contact a health care provider if:  You have new symptoms.  Your symptoms get worse or your pain is severe.  You have side effects from your medicines.  You have trouble  sleeping.  Your condition is causing depression or anxiety. Summary  Myofascial pain syndrome and fibromyalgia are pain disorders.  Myofascial pain syndrome has tender points in the muscle that will cause pain when pressed (trigger points). Fibromyalgia also has muscle pains and tenderness that come and go, but this condition is often associated with fatigue and sleep disturbances.  Fibromyalgia and myofascial pain syndrome are not the same but often occur together, causing pain and fatigue that make day-to-day activities difficult.  Treatment for fibromyalgia includes taking medicines to relax the muscles and medicines for pain, depression, or seizures. Treatment for myofascial pain syndrome includes taking medicines for pain, cooling and stretching of muscles, and injecting medicines into trigger points.  Follow your health care provider's instructions for taking medicines and maintaining a healthy lifestyle. This information is not intended to replace advice given to you by your health care provider. Make sure you discuss any questions you have with your health care provider. Document Revised: 03/29/2019 Document Reviewed: 12/20/2017 Elsevier Patient Education  2020 [employer].    Be well, Jules Schick, DO PGY-3, [facility]

## 2020-01-27 NOTE — Progress Notes (Signed)
   CHIEF COMPLAINT / HPI: Stomach pain and "pain all over"  Patient presents today for continued stomach pain and has previously been diagnosed with GERD. She has not been seen in our clinic in quite some time due to insurance issues. She has been out of her medications for GERD and was previously on ranitidine and omeprazole. She reported relief with these medications. She would like to restart them. I explained ranitidine is no longer offered but will restart an alternative in the same class.  Patient also endorses "pain all over her body". It is intermittent and occurs several times over the course of a week and started 2 weeks ago. She states if feels "cold" and like "something is freezing her all over". It has been present in her arms, legs, chest, neck, and back. She states the pain is not present all at once and jumps around from body part to body part. Nothing seems to make it worse, massage makes it better. Feels like she also has low energy. These episodes last only a few minutes.  PERTINENT  PMH / PSH: GERD, Arthralgia, Cervicalgia, Headache, Eczema   OBJECTIVE: BP (!) 96/52   Pulse 75   Wt 175 lb 9.6 oz (79.7 kg)   LMP 04/17/2013   SpO2 99%   BMI 28.34 kg/m   Gen: Alert and Oriented x 3, NAD HEENT: Normocephalic, atraumatic, PERRLA, EOMI, no scleral icterus CV: RRR, no murmurs, normal S1, S2 split Resp: CTAB, no wheezing, rales, or rhonchi, comfortable work of breathing Abd: non-distended, tender to palpation in the epigastric area, soft, +bs in all four quadrants MSK: >11 of 14 tender points positive for pain on palpation consistent with fibromyalgia Ext: no clubbing, cyanosis, or edema Skin: warm, dry, intact, no rashes  ASSESSMENT / PLAN:  Arthralgia Patient has had these symptoms in the past and it is consistent with fibromyalgia. Previous work up in the past for hypothyroidism, auto-immune diseases, or other organic alternative causes have not been fruitful in providing  answers to her pain.   Her pain pattern is consistent with fibromyalgia and given the duration and previous workup I will start treatment for that today. - Amitriptyline 10mg  QHS - F/u in one month - At next appointment screen for depression and consider adding SSRI that has some chronic pain benefit such as duloxetine as this may help get "two birds with one stone".     , DO, PGY-3 [facility] 01/27/2020

## 2020-01-29 NOTE — Assessment & Plan Note (Signed)
Patient has had these symptoms in the past and it is consistent with fibromyalgia. Previous work up in the past for hypothyroidism, auto-immune diseases, or other organic alternative causes have not been fruitful in providing answers to her pain.   Her pain pattern is consistent with fibromyalgia and given the duration and previous workup I will start treatment for that today. - Amitriptyline 10mg  QHS - F/u in one month - At next appointment screen for depression and consider adding SSRI that has some chronic pain benefit such as duloxetine as this may help get "two birds with one stone".

## 2020-02-18 ENCOUNTER — Other Ambulatory Visit: Payer: Self-pay | Admitting: *Deleted

## 2020-02-18 MED ORDER — AMITRIPTYLINE HCL 10 MG PO TABS: 10.0000 mg | ORAL_TABLET | Freq: Every day | ORAL | 1 refills | Status: DC

## 2020-02-20 ENCOUNTER — Other Ambulatory Visit: Payer: Self-pay | Admitting: *Deleted

## 2020-02-20 MED ORDER — AMITRIPTYLINE HCL 10 MG PO TABS: 10.0000 mg | ORAL_TABLET | Freq: Every day | ORAL | 1 refills | Status: DC

## 2020-03-10 ENCOUNTER — Ambulatory Visit (INDEPENDENT_AMBULATORY_CARE_PROVIDER_SITE_OTHER): Payer: 59 | Admitting: Family Medicine

## 2020-03-10 ENCOUNTER — Ambulatory Visit
Admission: RE | Admit: 2020-03-10 | Discharge: 2020-03-10 | Disposition: A | Discharge status: Home / Self Care | Payer: 59 | Source: Ambulatory Visit | Attending: Family Medicine | Admitting: Family Medicine

## 2020-03-10 ENCOUNTER — Encounter: Payer: Self-pay | Admitting: Family Medicine

## 2020-03-10 ENCOUNTER — Other Ambulatory Visit: Payer: Self-pay

## 2020-03-10 VITALS — BP 110/62 | HR 84 | Ht 66.0 in | Wt 170.0 lb

## 2020-03-10 DIAGNOSIS — M65331 Trigger finger, right middle finger: Secondary | ICD-10-CM

## 2020-03-10 NOTE — Patient Instructions (Signed)
Trigger Finger  Trigger finger, also called stenosing tenosynovitis,  is a condition that causes a finger to get stuck in a bent position. Each finger has a tendon, which is a tough, cord-like tissue that connects muscle to bone, and each tendon passes through a tunnel of tissue called a tendon sheath. To move your finger, your tendon needs to glide freely through the sheath. Trigger finger happens when the tendon or the sheath thickens, making it difficult to move your finger. Trigger finger can affect any finger or a thumb. It may affect more than one finger. Mild cases may clear up with rest and medicine. Severe cases require more treatment. What are the causes? Trigger finger is caused by a thickened finger tendon or tendon sheath. The cause of this thickening is not known. What increases the risk? The following factors may make you more likely to develop this condition:  Doing activities that require a strong grip.  Having rheumatoid arthritis, gout, or diabetes.  Being 40-60 years old.  Being female. What are the signs or symptoms? Symptoms of this condition include:  Pain when bending or straightening your finger.  Tenderness or swelling where your finger attaches to the palm of your hand.  A lump in the palm of your hand or on the inside of your finger.  Hearing a noise like a pop or a snap when you try to straighten your finger.  Feeling a catching or locking sensation when you try to straighten your finger.  Being unable to straighten your finger. How is this diagnosed? This condition is diagnosed based on your symptoms and a physical exam. How is this treated? This condition may be treated by:  Resting your finger and avoiding activities that make symptoms worse.  Wearing a finger splint to keep your finger extended.  Taking NSAIDs, such as ibuprofen, to relieve pain and swelling.  Doing gentle exercises to stretch the finger as told by your health care provider.   Having medicine that reduces swelling and inflammation (steroids) injected into the tendon sheath. Injections may need to be repeated.  Having surgery to open the tendon sheath. This may be done if other treatments do not work and you cannot straighten your finger. You may need physical therapy after surgery. Follow these instructions at home: If you have a splint:  Wear the splint as told by your health care provider. Remove it only as told by your health care provider.  Loosen it if your fingers tingle, become numb, or turn cold and blue.  Keep it clean.  If the splint is not waterproof: ? Do not let it get wet. ? Cover it with a watertight covering when you take a bath or shower. Managing pain, stiffness, and swelling     If directed, apply heat to the affected area as often as told by your health care provider. Use the heat source that your health care provider recommends, such as a moist heat pack or a heating pad.  Place a towel between your skin and the heat source.  Leave the heat on for 20-30 minutes.  Remove the heat if your skin turns bright red. This is especially important if you are unable to feel pain, heat, or cold. You may have a greater risk of getting burned. If directed, put ice on the painful area. To do this:  If you have a removable splint, remove it as told by your health care provider.  Put ice in a plastic bag.  Place a   towel between your skin and the bag or between your splint and the bag.  Leave the ice on for 20 minutes, 2-3 times a day.  Activity  Rest your finger as told by your health care provider. Avoid activities that make the pain worse.  Return to your normal activities as told by your health care provider. Ask your health care provider what activities are safe for you.  Do exercises as told by your health care provider.  Ask your health care provider when it is safe to drive if you have a splint on your hand. General instructions   Take over-the-counter and prescription medicines only as told by your health care provider.  Keep all follow-up visits as told by your health care provider. This is important. Contact a health care provider if:  Your symptoms are not improving with home care. Summary  Trigger finger, also called stenosing tenosynovitis, causes your finger to get stuck in a bent position. This can make it difficult and painful to straighten your finger.  This condition develops when a finger tendon or tendon sheath thickens.  Treatment may include resting your finger, wearing a splint, and taking medicines.  In severe cases, surgery to open the tendon sheath may be needed. This information is not intended to replace advice given to you by your health care provider. Make sure you discuss any questions you have with your health care provider. Document Revised: 04/22/2019 Document Reviewed: 04/22/2019 Elsevier Patient Education  2020 [employer]  

## 2020-03-10 NOTE — Progress Notes (Signed)
    SUBJECTIVE:   CHIEF COMPLAINT / HPI:   Follow Up Patient was seen and evaluated by Cardiology in regards to chest "heaviness". Per cards note, she did not need a stress test. I am more reassured today as she has had resolution of her symptoms after starting back on Pepcid and Protonix. She states protonix made it difficult for her to sleep so she stopped it and is just taking pepcid. She has no more GERD symptoms or chest heaviness at this time.   Right Middle Finger  Patient states the right middle finger gets "stuck" at night time. She says during the day she can move it, use it, and do everything she normally does without pain. It does not give her any problems during the day. She does note some mild intermittent swelling. However, at night she goes to sleep and when she wakes up her finger is stuck in flexion and she has to use her other hand to extend the finger. She says this works but it has worried her a little bit as she doesn't want to one day wake up and be unable to use her finger.  PERTINENT  PMH / PSH:   OBJECTIVE:   BP 110/62   Pulse 84   Ht 5\' 6"  (1.676 m)   Wt 170 lb (77.1 kg)   LMP 04/17/2013   SpO2 97%   BMI 27.44 kg/m   Gen: Alert and Oriented x 3, NAD CV: RRR, no murmurs, normal S1, S2 split Resp: CTAB, no wheezing, rales, or rhonchi, comfortable work of breathing MSK: Hand, Right:  Inspection yielded no erythema, ecchymosis, bony deformity, slight swelling of the 3rd right middle finger but no tenderness. ROM full with good flexion and extension and ulnar/radial deviation that is symmetrical with opposite wrist. Palpation is normal over metacarpals, scaphoid, lunate, and TFCC; tendons without tenderness/swelling. Strength 5/5 in all directions without pain. Negative Finkelstein, tinel's and phalens. Ext: no clubbing, cyanosis, or edema Skin: warm, dry, intact, no rashes   ASSESSMENT/PLAN:   Trigger middle finger of right hand Given her history and exam  reassured this is nothing traumatic, no swelling or pain on the MCP or PIP no deformity making RA less likely. Given age and no pain on movement OA unlikely as well. This is most likely trigger finger. - Hand out given on conservative management with use of night splint, ice, and NSAIDs use for swelling. - Right hand x-ray to rule out any fracture or deformity of the bones. X-ray negative. - F/u as needed   04/19/2013, DO [facility]

## 2020-03-11 ENCOUNTER — Telehealth: Payer: Self-pay | Admitting: *Deleted

## 2020-03-11 NOTE — Telephone Encounter (Signed)
LMOVM informing pt. Jariel Drost, CMA  

## 2020-03-11 NOTE — Assessment & Plan Note (Addendum)
Given her history and exam reassured this is nothing traumatic, no swelling or pain on the MCP or PIP no deformity making RA less likely. Given age and no pain on movement OA unlikely as well. This is most likely trigger finger. - Hand out given on conservative management with use of night splint, ice, and NSAIDs use for swelling. - Right hand x-ray to rule out any fracture or deformity of the bones. X-ray negative. - F/u as needed

## 2020-03-11 NOTE — Telephone Encounter (Signed)
-----   Message from Arlyce Harman, DO sent at 03/11/2020  1:32 PM EDT ----- Regarding: Hand x-ray Can we call the patient and let her know her hand x-ray was completely normal? Thanks!  Tim

## 2020-04-08 ENCOUNTER — Ambulatory Visit: Payer: 59 | Admitting: Family Medicine

## 2020-04-10 ENCOUNTER — Ambulatory Visit: Payer: 59 | Admitting: Family Medicine

## 2020-04-17 ENCOUNTER — Telehealth: Payer: Self-pay | Admitting: *Deleted

## 2020-04-17 ENCOUNTER — Other Ambulatory Visit: Payer: Self-pay | Admitting: Family Medicine

## 2020-04-17 MED ORDER — PANTOPRAZOLE SODIUM 20 MG PO TBEC: 20.0000 mg | DELAYED_RELEASE_TABLET | Freq: Every day | ORAL | 2 refills | Status: DC

## 2020-04-17 NOTE — Telephone Encounter (Signed)
Rx request for pantoprazole sod dr 20mg  tablets. Not on med list. Please advise. Ramondo Dietze , CMA

## 2020-04-17 NOTE — Progress Notes (Signed)
Patient requested pantoprazole via pharmacy 20mg  daily. Sent prescription to pharmacy on file

## 2020-05-20 ENCOUNTER — Other Ambulatory Visit: Payer: Self-pay

## 2020-05-20 ENCOUNTER — Encounter (HOSPITAL_COMMUNITY): Payer: Self-pay

## 2020-05-20 ENCOUNTER — Ambulatory Visit (HOSPITAL_COMMUNITY)
Admission: EM | Admit: 2020-05-20 | Discharge: 2020-05-20 | Disposition: A | Discharge status: Home / Self Care | Payer: 59 | Attending: Internal Medicine | Admitting: Internal Medicine

## 2020-05-20 ENCOUNTER — Ambulatory Visit (INDEPENDENT_AMBULATORY_CARE_PROVIDER_SITE_OTHER): Payer: 59

## 2020-05-20 DIAGNOSIS — S838X2A Sprain of other specified parts of left knee, initial encounter: Secondary | ICD-10-CM

## 2020-05-20 MED ORDER — IBUPROFEN 600 MG PO TABS
600.0000 mg | ORAL_TABLET | Freq: Four times a day (QID) | ORAL | 0 refills | Status: DC | PRN
Start: 2020-05-20 — End: 2021-11-29

## 2020-05-20 NOTE — ED Triage Notes (Signed)
Pt slipped and fell yesterday. Pt states she did hit her head. Pt denies LOC. Pt states she injured her left leg and arm. Pt was able to walk to exam room. PT has some edema on left knee in an area.
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
## 2020-05-21 ENCOUNTER — Ambulatory Visit: Payer: 59 | Admitting: Family Medicine

## 2020-06-08 ENCOUNTER — Encounter: Payer: Self-pay | Admitting: Family Medicine

## 2020-06-08 ENCOUNTER — Other Ambulatory Visit (HOSPITAL_COMMUNITY)
Admission: RE | Admit: 2020-06-08 | Discharge: 2020-06-08 | Disposition: A | Discharge status: Home / Self Care | Payer: 59 | Source: Ambulatory Visit | Attending: Family Medicine | Admitting: Family Medicine

## 2020-06-08 ENCOUNTER — Ambulatory Visit (INDEPENDENT_AMBULATORY_CARE_PROVIDER_SITE_OTHER): Payer: 59 | Admitting: Family Medicine

## 2020-06-08 ENCOUNTER — Other Ambulatory Visit: Payer: Self-pay

## 2020-06-08 VITALS — BP 110/68 | HR 65 | Ht 66.0 in | Wt 163.2 lb

## 2020-06-08 DIAGNOSIS — Z0000 Encounter for general adult medical examination without abnormal findings: Secondary | ICD-10-CM | POA: Diagnosis present

## 2020-06-08 DIAGNOSIS — M25562 Pain in left knee: Secondary | ICD-10-CM

## 2020-06-08 NOTE — Progress Notes (Signed)
    SUBJECTIVE:   CHIEF COMPLAINT / HPI:   Left Knee Sprain F/u Patient states two weeks ago she was walking while shopping and stepped wrong and did not fall but felt a pull in her knee and it was painful. She went to urgent care and was diagnosed with a knee sprain and was given Ibuprofen 600mg  q6 as needed for pain. She also had x-rays which were negative. She states overall, her knee pain has greatly improved and denies any swelling or the pain effecting her ability to walk or perform ADLs. It is still a little tender to pushed in certain areas but overall feeling much better.  Travelling to Patient is travelling to the [place] in a few weeks as her father is sick. She is here to get the recommended vaccinations and to make sure she is up to date on her health care. Pap Smear, Hep C, vaccines given.   PERTINENT  PMH / PSH: GERD, Ezcema  OBJECTIVE:   BP 110/68   Pulse 65   Ht 5\' 6"  (1.676 m)   Wt 163 lb 3.2 oz (74 kg)   LMP 04/17/2013   SpO2 100%   BMI 26.34 kg/m   Gen: NAD MSK: Knee, Left: TTP noted at the medial joint line. Inspection was negative for erythema, ecchymosis, and effusion. No obvious bony abnormalities or signs of osteophyte development. Palpation yielded no asymmetric warmth;  No condyle tenderness; No patellar tenderness; No patellar crepitus. Patellar and quadriceps tendons unremarkable, and no tenderness of the pes anserine bursa. No obvious Baker's cyst development. ROM normal in flexion (135 degrees) and extension (0 degrees). Normal hamstring and quadriceps strength. Neurovascularly intact bilaterally.  - Ligaments: (Solid and consistent endpoints)   - ACL (present bilaterally)   - PCL (present bilaterally)   - LCL (present bilaterally)   - MCL (present bilaterally).   - Additional tests performed:    - Anterior Drawer >> NEG  - Meniscus:   - Thessaly: NEG   ASSESSMENT/PLAN:   Left knee pain Most likely acute knee sprain. Improving. No signs of  ligament damage. Could have a component of meniscal tear but given age and activity level and her pain is improving I would not recommend surgery as it would likely not benefit her in any appreciable way.  Healthcare maintenance Patient received vaccines needed for travel and pap smear     , DO [facility]

## 2020-06-08 NOTE — Patient Instructions (Addendum)
It was great to meet you today! Thank you for letting me participate in your care!  Today, we discussed your left knee pain which could be a sprain of the knee but also could be an injury to your meniscus. As long as it continues to get better you should not need to do anything.  We have gotten you up to date on your pap smear and I will call you if anything is abnormal. I have also gotten you up to date on your vaccines for travel. Please travel safe!  Be well, Jules Schick, DO PGY-3, [facility]

## 2020-06-09 DIAGNOSIS — M25562 Pain in left knee: Secondary | ICD-10-CM | POA: Insufficient documentation

## 2020-06-09 LAB — HCV AB W REFLEX TO QUANT PCR: HCV Ab: <0.1 s/co ratio (ref 0.0–0.9)

## 2020-06-09 LAB — HCV INTERPRETATION

## 2020-06-09 NOTE — Assessment & Plan Note (Signed)
Patient received vaccines needed for travel and pap smear

## 2020-06-09 NOTE — Assessment & Plan Note (Signed)
Most likely acute knee sprain. Improving. No signs of ligament damage. Could have a component of meniscal tear but given age and activity level and her pain is improving I would not recommend surgery as it would likely not benefit her in any appreciable way.

## 2020-06-10 LAB — CYTOLOGY - PAP
Comment: NEGATIVE
Diagnosis: NEGATIVE
Diagnosis: REACTIVE
High risk HPV: NEGATIVE

## 2020-06-24 ENCOUNTER — Other Ambulatory Visit: Payer: Self-pay

## 2020-06-24 ENCOUNTER — Ambulatory Visit (INDEPENDENT_AMBULATORY_CARE_PROVIDER_SITE_OTHER): Payer: 59

## 2020-06-24 DIAGNOSIS — Z23 Encounter for immunization: Secondary | ICD-10-CM

## 2020-06-24 DIAGNOSIS — Z7184 Encounter for health counseling related to travel: Secondary | ICD-10-CM

## 2020-06-24 NOTE — Progress Notes (Signed)
Patient presents in nurse clinic for MMR and Varicella injections.   See flowsheet for vaccine administration details.   Patient tolerated all injections well.   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 Patient has had Varicella and MMR in 2005 with no noted reaction, per patient.

## 2021-05-06 ENCOUNTER — Ambulatory Visit: Payer: 59 | Admitting: Family Medicine

## 2021-05-10 ENCOUNTER — Ambulatory Visit (INDEPENDENT_AMBULATORY_CARE_PROVIDER_SITE_OTHER): Payer: 59 | Admitting: Family Medicine

## 2021-05-10 ENCOUNTER — Encounter: Payer: Self-pay | Admitting: Family Medicine

## 2021-05-10 ENCOUNTER — Other Ambulatory Visit: Payer: Self-pay

## 2021-05-10 VITALS — BP 120/64 | HR 97 | Ht 66.0 in | Wt 171.0 lb

## 2021-05-10 DIAGNOSIS — Z0000 Encounter for general adult medical examination without abnormal findings: Secondary | ICD-10-CM | POA: Diagnosis not present

## 2021-05-10 DIAGNOSIS — Z1211 Encounter for screening for malignant neoplasm of colon: Secondary | ICD-10-CM | POA: Diagnosis not present

## 2021-05-10 DIAGNOSIS — R1013 Epigastric pain: Secondary | ICD-10-CM | POA: Diagnosis not present

## 2021-05-10 DIAGNOSIS — K219 Gastro-esophageal reflux disease without esophagitis: Secondary | ICD-10-CM

## 2021-05-10 DIAGNOSIS — Z1231 Encounter for screening mammogram for malignant neoplasm of breast: Secondary | ICD-10-CM

## 2021-05-10 MED ORDER — PANTOPRAZOLE SODIUM 40 MG PO TBEC: 40.0000 mg | DELAYED_RELEASE_TABLET | Freq: Every day | ORAL | 3 refills | Status: DC

## 2021-05-10 NOTE — Assessment & Plan Note (Signed)
-  Placed ambulatory referral to GI for colonoscopy -Mammogram ordered, provided information about the breast center to schedule appointment

## 2021-05-10 NOTE — Assessment & Plan Note (Signed)
Not currently taking any medications for this.  She denies taking any medications for his in the past 2 weeks.  [place]  I would like to rule out H. pylori infection just based on the fact that she is spent the majority of her life abroad and does have some risk for H. pylori. -Follow-up H. pylori testing -Start PPI, see AVS for details -Consider referral to GI if no improvement on PPI for 2 weeks.

## 2021-05-10 NOTE — Progress Notes (Signed)
    SUBJECTIVE:   CHIEF COMPLAINT / HPI:   Reflux She reports that she has been middle of her stomach pain for roughly the past 2 weeks.  She notices this most in the morning when she wakes up and immediately following meals.  Seems to be most strong following her afternoon meal.  She reports this is a discomfort following her rib cage but primarily in the center of her upper stomach.  She does not have any trouble breathing.  She denies nausea, vomiting, constipation, diarrhea.  She has not previously had this issue.  She is from [place] and also spent some time in [place].  But she has been in [place] for roughly the past 15 years.  PERTINENT  PMH / PSH: GERD  OBJECTIVE:   BP 120/64   Pulse 97   Ht 5\' 6"  (1.676 m)   Wt 171 lb (77.6 kg)   LMP 04/17/2013   SpO2 98%   BMI 27.60 kg/m    General: Alert and cooperative and appears to be in no acute distress Cardio: Normal S1 and S2, no S3 or S4. Rhythm is regular. No murmurs or rubs.   Pulm: Clear to auscultation bilaterally, no crackles, wheezing, or diminished breath sounds. Normal respiratory effort Abdomen: Bowel sounds normal. Abdomen soft.  Mildly tender to palpation in the right lower quadrant.  No significant subcostal or epigastric tenderness on exam. Extremities: No peripheral edema. Warm/ well perfused.  Strong radial pulses. Neuro: Cranial nerves grossly intact  ASSESSMENT/PLAN:   GERD (gastroesophageal reflux disease) Not currently taking any medications for this.  She denies taking any medications for his in the past 2 weeks.  [place]  I would like to rule out H. pylori infection just based on the fact that she is spent the majority of her life abroad and does have some risk for H. pylori. -Follow-up H. pylori testing -Start PPI, see AVS for details -Consider referral to GI if no improvement on PPI for 2 weeks.  Healthcare maintenance -Placed ambulatory referral  to GI for colonoscopy -Mammogram ordered, provided information about the breast center to schedule appointment     04/19/2013, MD [facility]

## 2021-05-10 NOTE — Patient Instructions (Signed)
It was great to see you today.  Here is a quick review of the things we talked about:  Reflux: Today, we are going to test you for a stomach infection called H. pylori.  You can start taking the medication today called Protonix.  I like he takes every day for the next 2 weeks.  If this does not improve your symptoms, please let me know and I would like you to see a special stomach doctor.  If this does not improve your symptoms, you only need to take this medicine when you have trouble with your stomach.  Colon cancer screening: I have placed a referral to GI. You should get a call in the next 1-2 weeks to set up your appointment.  Please let me know if you have not received a call in the next 2 weeks.  Breast cancer screening: I have placed an order for you to have a mammogram performed.  Please call the breast center to schedule your appointment for breast cancer screening.

## 2021-05-12 LAB — H. PYLORI BREATH TEST: H pylori Breath Test: NEGATIVE

## 2021-05-13 ENCOUNTER — Encounter: Payer: Self-pay | Admitting: Family Medicine

## 2021-07-06 ENCOUNTER — Inpatient Hospital Stay: Admission: RE | Admit: 2021-07-06 | Payer: 59 | Source: Ambulatory Visit

## 2021-07-06 ENCOUNTER — Other Ambulatory Visit: Payer: Self-pay

## 2021-07-06 ENCOUNTER — Ambulatory Visit
Admission: RE | Admit: 2021-07-06 | Discharge: 2021-07-06 | Disposition: A | Discharge status: Home / Self Care | Payer: 59 | Source: Ambulatory Visit | Attending: Family Medicine | Admitting: Family Medicine

## 2021-07-06 DIAGNOSIS — Z1231 Encounter for screening mammogram for malignant neoplasm of breast: Secondary | ICD-10-CM

## 2021-08-04 ENCOUNTER — Other Ambulatory Visit: Payer: Self-pay | Admitting: Family Medicine

## 2021-11-29 ENCOUNTER — Emergency Department (HOSPITAL_COMMUNITY): Payer: 59

## 2021-11-29 ENCOUNTER — Emergency Department (HOSPITAL_COMMUNITY)
Admission: EM | Admit: 2021-11-29 | Discharge: 2021-11-29 | Disposition: A | Discharge status: Home / Self Care | Payer: 59 | Attending: Emergency Medicine | Admitting: Emergency Medicine

## 2021-11-29 DIAGNOSIS — M542 Cervicalgia: Secondary | ICD-10-CM | POA: Insufficient documentation

## 2021-11-29 DIAGNOSIS — M545 Low back pain, unspecified: Secondary | ICD-10-CM | POA: Insufficient documentation

## 2021-11-29 DIAGNOSIS — S0990XA Unspecified injury of head, initial encounter: Secondary | ICD-10-CM | POA: Insufficient documentation

## 2021-11-29 DIAGNOSIS — Y92410 Unspecified street and highway as the place of occurrence of the external cause: Secondary | ICD-10-CM | POA: Diagnosis not present

## 2021-11-29 MED ORDER — IBUPROFEN 800 MG PO TABS: 800.0000 mg | ORAL_TABLET | Freq: Three times a day (TID) | ORAL | 0 refills | Status: DC | PRN

## 2021-11-29 NOTE — Discharge Instructions (Signed)
Follow-up with your doctor if any problems 
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
## 2021-12-10 ENCOUNTER — Encounter: Payer: Self-pay | Admitting: Family Medicine

## 2021-12-10 ENCOUNTER — Other Ambulatory Visit: Payer: Self-pay

## 2021-12-10 ENCOUNTER — Ambulatory Visit (INDEPENDENT_AMBULATORY_CARE_PROVIDER_SITE_OTHER): Payer: 59 | Admitting: Family Medicine

## 2021-12-10 VITALS — BP 116/82 | HR 78 | Wt 184.0 lb

## 2021-12-10 DIAGNOSIS — M545 Low back pain, unspecified: Secondary | ICD-10-CM | POA: Diagnosis not present

## 2021-12-10 MED ORDER — BACLOFEN 10 MG PO TABS: 10.0000 mg | ORAL_TABLET | Freq: Two times a day (BID) | ORAL | 0 refills | Status: DC | PRN
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
## 2022-03-04 ENCOUNTER — Ambulatory Visit (INDEPENDENT_AMBULATORY_CARE_PROVIDER_SITE_OTHER): Payer: 59 | Admitting: Family Medicine

## 2022-03-04 ENCOUNTER — Other Ambulatory Visit: Payer: Self-pay

## 2022-03-04 ENCOUNTER — Encounter: Payer: Self-pay | Admitting: Family Medicine

## 2022-03-04 VITALS — BP 117/79 | HR 76 | Ht 66.0 in | Wt 182.8 lb

## 2022-03-04 DIAGNOSIS — M545 Low back pain, unspecified: Secondary | ICD-10-CM | POA: Diagnosis not present

## 2022-03-04 MED ORDER — BACLOFEN 10 MG PO TABS: 10.0000 mg | ORAL_TABLET | Freq: Two times a day (BID) | ORAL | 0 refills | Status: AC | PRN

## 2022-03-04 NOTE — Patient Instructions (Addendum)
It was great seeing you today! ? ?Today we discussed your back pain. I think this is due from a sudden movement and affecting your muscles causing spasms. For this, I have prescribed a 7 day course of baclofen, you may take no more than 2 tablets a day as needed for the pain. I also recommended applying heat to the area. I have also referred you to physical therapy, you should hear from them within 1-2 weeks. Physical therapy can help stretch those muscles as well so that they are not so tight. This will help you get more strength and hopefully relieve your symptoms.  ? ?If you are having pain that radiates your groin or severely sharp pain than please go to the emergency department.  ? ?Please follow up with Dr. Idalia Needle for a physical.  ? ?Please follow up at your next scheduled appointment, if anything arises between now and then, please don't hesitate to contact our office. ? ? ?Thank you for allowing [place] to be a part of your medical care! ? ?Thank you, ?Dr. Robyne Peers  ?
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
## 2022-03-05 DIAGNOSIS — M545 Low back pain, unspecified: Secondary | ICD-10-CM | POA: Insufficient documentation
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
## 2022-03-14 ENCOUNTER — Ambulatory Visit: Payer: 59 | Attending: Family Medicine

## 2022-03-14 ENCOUNTER — Other Ambulatory Visit: Payer: Self-pay

## 2022-03-14 DIAGNOSIS — M6281 Muscle weakness (generalized): Secondary | ICD-10-CM | POA: Insufficient documentation

## 2022-03-14 DIAGNOSIS — G8929 Other chronic pain: Secondary | ICD-10-CM | POA: Insufficient documentation

## 2022-03-14 DIAGNOSIS — M545 Low back pain, unspecified: Secondary | ICD-10-CM | POA: Diagnosis present
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
## 2022-03-24 ENCOUNTER — Ambulatory Visit: Payer: 59 | Attending: Family Medicine

## 2022-03-24 DIAGNOSIS — M545 Low back pain, unspecified: Secondary | ICD-10-CM | POA: Diagnosis present

## 2022-03-24 DIAGNOSIS — G8929 Other chronic pain: Secondary | ICD-10-CM | POA: Diagnosis present

## 2022-03-24 DIAGNOSIS — M6281 Muscle weakness (generalized): Secondary | ICD-10-CM | POA: Diagnosis not present
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
## 2022-03-29 ENCOUNTER — Ambulatory Visit: Payer: 59

## 2022-03-29 DIAGNOSIS — M6281 Muscle weakness (generalized): Secondary | ICD-10-CM | POA: Diagnosis not present

## 2022-03-29 DIAGNOSIS — G8929 Other chronic pain: Secondary | ICD-10-CM
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
## 2022-04-04 ENCOUNTER — Ambulatory Visit: Payer: 59

## 2022-04-04 DIAGNOSIS — G8929 Other chronic pain: Secondary | ICD-10-CM

## 2022-04-04 DIAGNOSIS — M6281 Muscle weakness (generalized): Secondary | ICD-10-CM | POA: Diagnosis not present
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
## 2022-05-13 IMAGING — CR DG LUMBAR SPINE COMPLETE 4+V
5 series · 5 of 5 positions shown · non-contrast
Comparison: 08/27/2014

CLINICAL DATA: Back pain with left-sided radiculopathy

EXAM:
LUMBAR SPINE - COMPLETE 4+ VIEW

[l-spine ap]
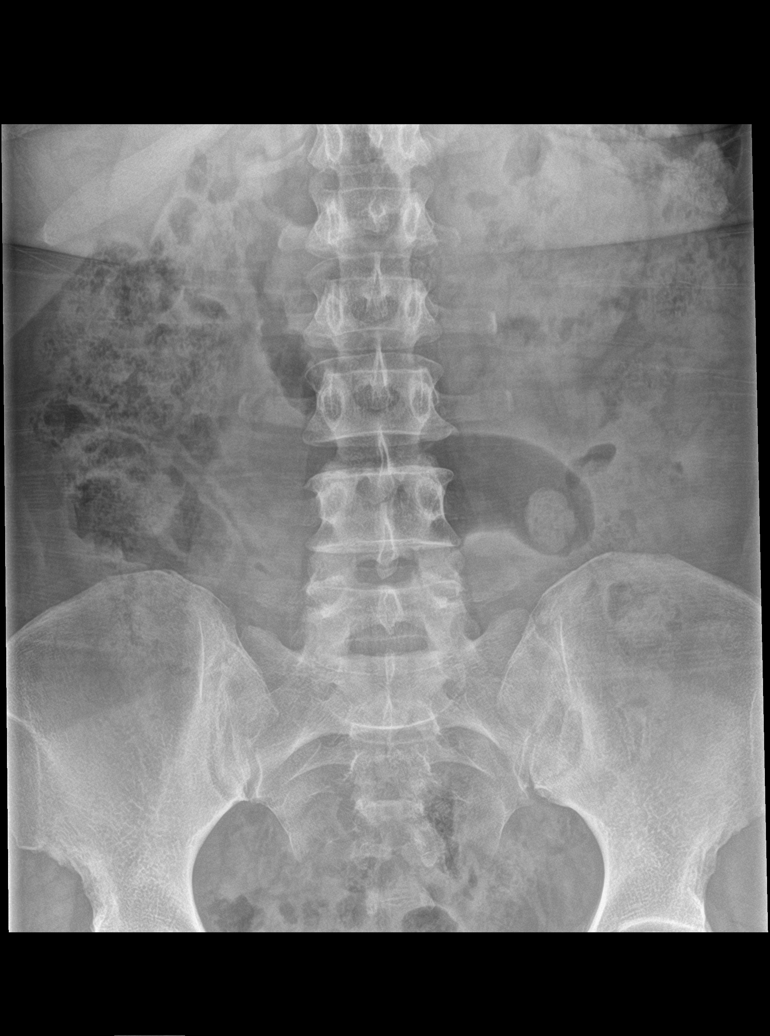

[l-spine obl (1 of 2)]
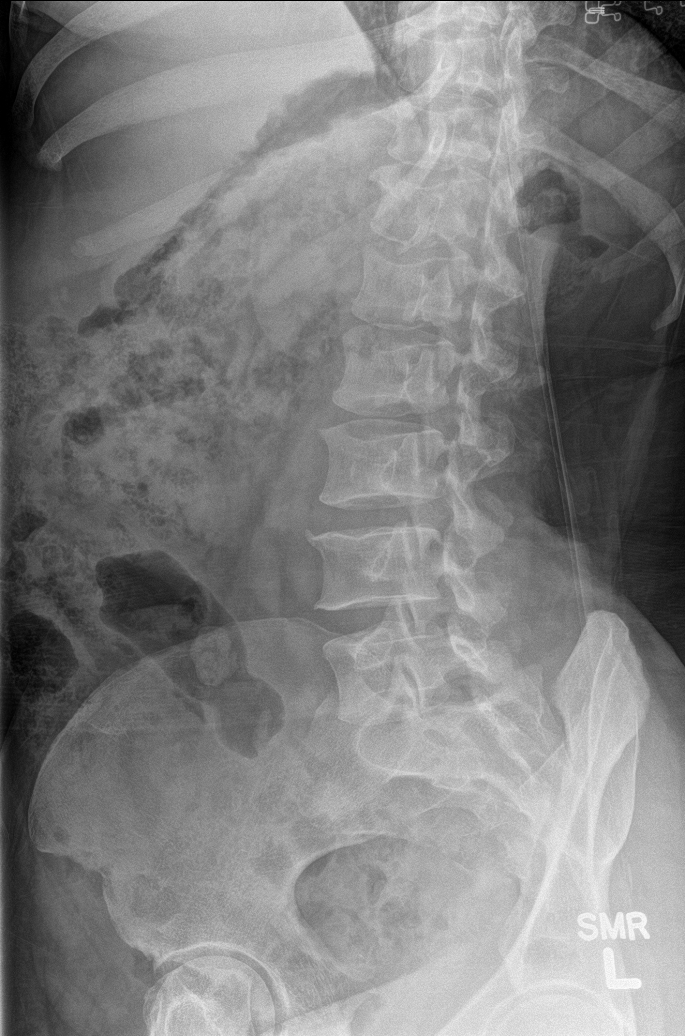

[l-spine obl (2 of 2)]
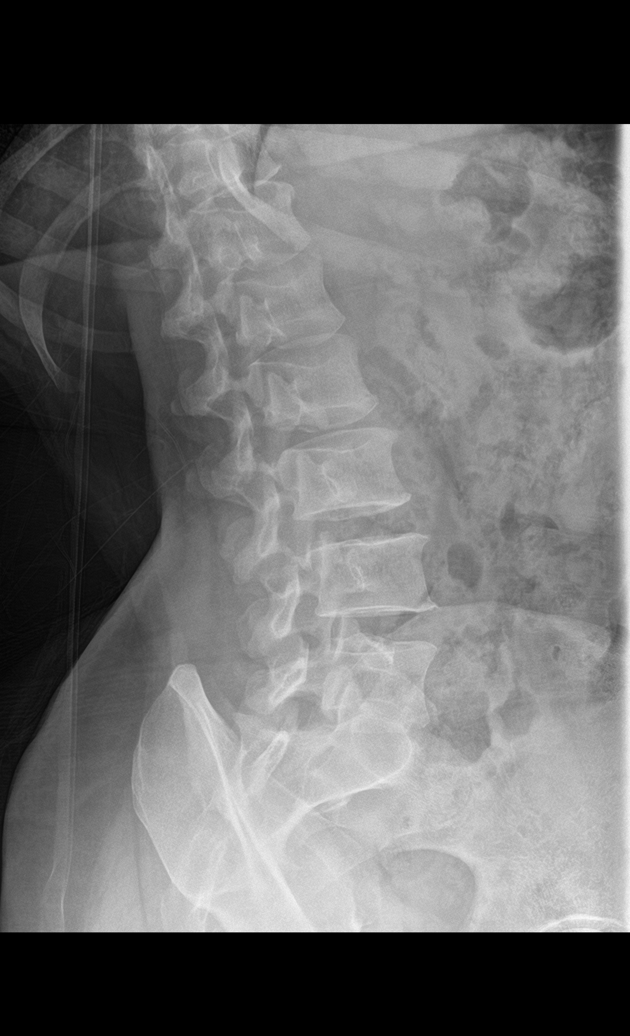

[l-spine spot]
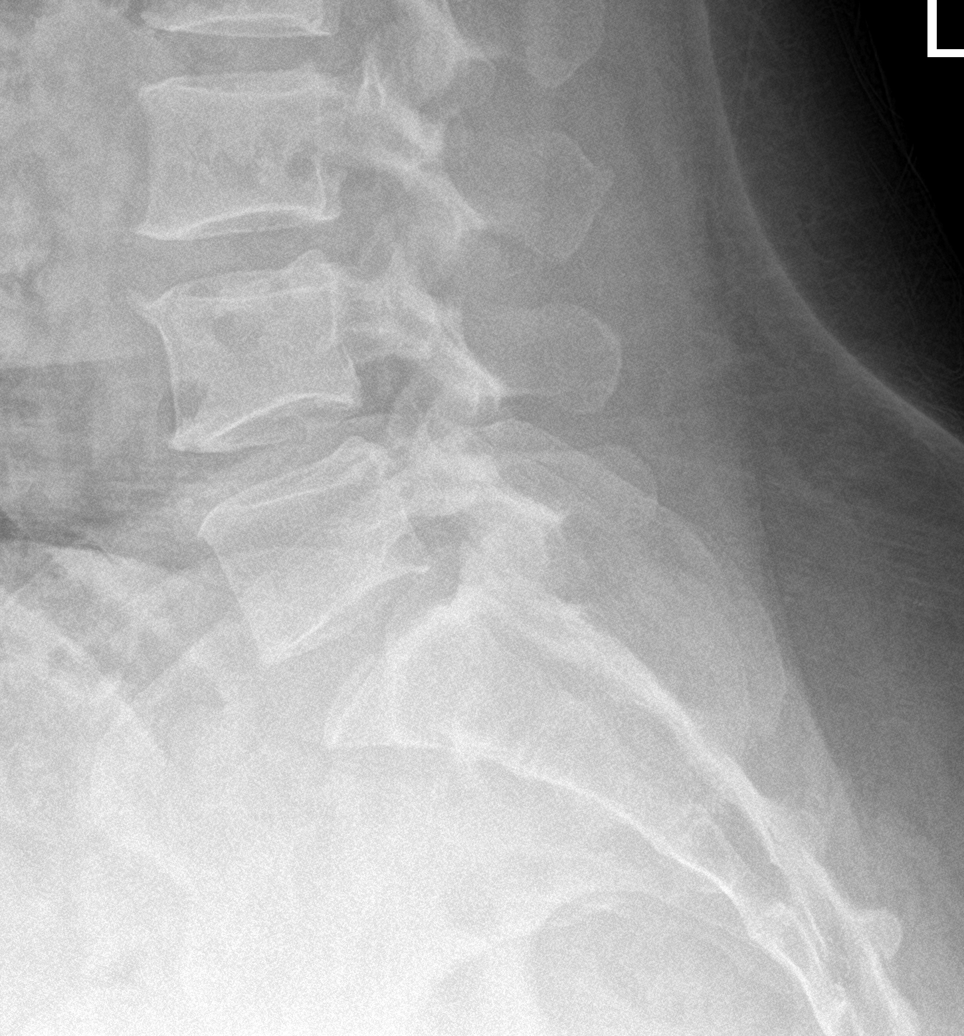

[l-spine lat]
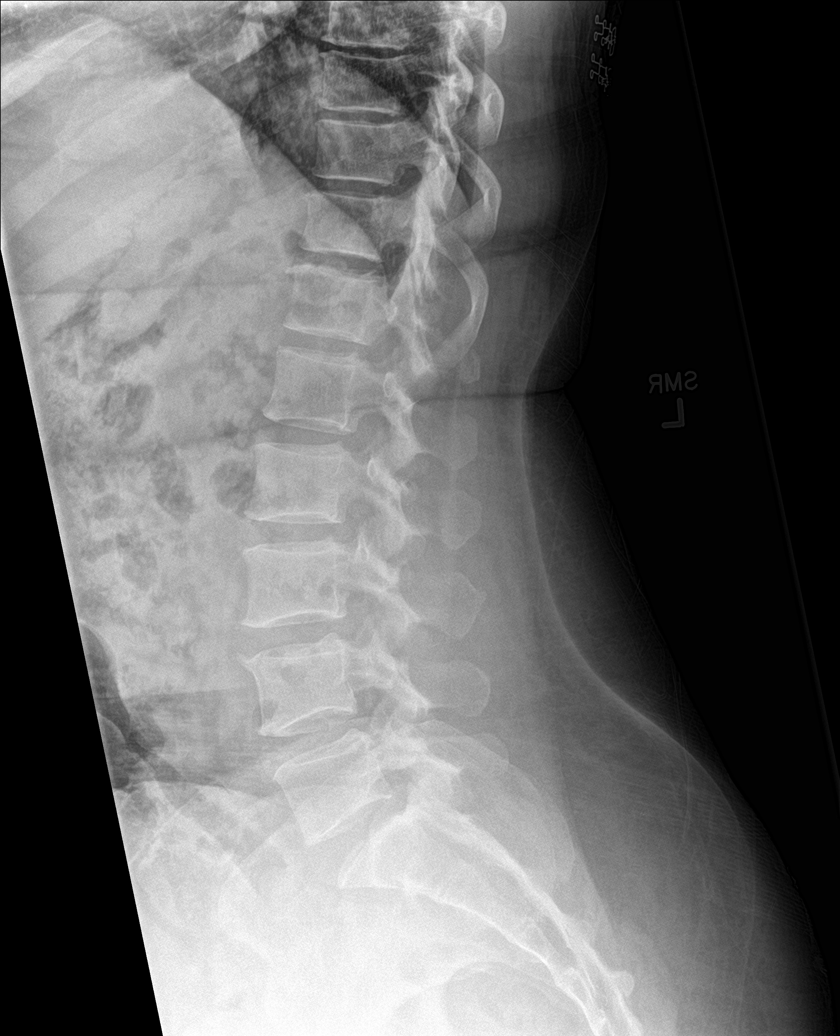

[5 of 5 positions shown; findings below may reference images not displayed]

FINDINGS: Hypoplastic T12 ribs with 5 lumbar type vertebral segments.
Vertebral body heights are maintained. There is no evidence of
lumbar spine fracture. Alignment is normal. Intervertebral disc
spaces are maintained. No significant facet joint arthropathy.
IMPRESSION: Negative.
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
## 2022-12-22 ENCOUNTER — Ambulatory Visit (HOSPITAL_COMMUNITY)
Admission: EM | Admit: 2022-12-22 | Discharge: 2022-12-22 | Disposition: A | Discharge status: Home / Self Care | Payer: Self-pay | Attending: Emergency Medicine | Admitting: Emergency Medicine

## 2022-12-22 ENCOUNTER — Encounter (HOSPITAL_COMMUNITY): Payer: Self-pay

## 2022-12-22 DIAGNOSIS — L509 Urticaria, unspecified: Secondary | ICD-10-CM

## 2022-12-22 MED ORDER — METHYLPREDNISOLONE SODIUM SUCC 125 MG IJ SOLR
INTRAMUSCULAR | Status: AC
  Filled 2022-12-22: qty 2

## 2022-12-22 MED ORDER — PREDNISONE 10 MG (21) PO TBPK: ORAL_TABLET | Freq: Every day | ORAL | 0 refills | Status: DC

## 2022-12-22 MED ORDER — METHYLPREDNISOLONE SODIUM SUCC 125 MG IJ SOLR
60.0000 mg | Freq: Once | INTRAMUSCULAR | Status: AC
  Administered 2022-12-22: 60 mg via INTRAMUSCULAR
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
## 2022-12-22 NOTE — Discharge Instructions (Addendum)
Please take the oral prednisone as prescribed, starting tomorrow morning. The instructions will be on the package to follow.  Benadryl can be taken at night as well.  Please monitor for change in symptoms. Avoid potential allergens.  Return as needed.

## 2022-12-22 NOTE — ED Triage Notes (Signed)
2-day h/o rash all over body. Pt has not taken any medication to help with her rash.

## 2023-05-08 ENCOUNTER — Encounter: Payer: Self-pay | Admitting: Family Medicine

## 2023-05-08 ENCOUNTER — Other Ambulatory Visit: Payer: Self-pay

## 2023-05-08 ENCOUNTER — Ambulatory Visit (INDEPENDENT_AMBULATORY_CARE_PROVIDER_SITE_OTHER): Payer: Self-pay | Admitting: Family Medicine

## 2023-05-08 VITALS — BP 121/80 | HR 67 | Ht 66.0 in | Wt 168.2 lb

## 2023-05-08 DIAGNOSIS — H539 Unspecified visual disturbance: Secondary | ICD-10-CM

## 2023-05-08 DIAGNOSIS — L309 Dermatitis, unspecified: Secondary | ICD-10-CM

## 2023-05-08 DIAGNOSIS — G43809 Other migraine, not intractable, without status migrainosus: Secondary | ICD-10-CM

## 2023-05-08 MED ORDER — TRIAMCINOLONE ACETONIDE 0.5 % EX OINT: 1.0000 | TOPICAL_OINTMENT | Freq: Two times a day (BID) | CUTANEOUS | 0 refills | Status: DC

## 2023-05-08 MED ORDER — SUMATRIPTAN SUCCINATE 25 MG PO TABS: 25.0000 mg | ORAL_TABLET | ORAL | 0 refills | Status: DC | PRN

## 2023-05-08 NOTE — Assessment & Plan Note (Signed)
Patient has itchy papules on extremities and torso that recur with hot or cold weather. She uses benadryl cream which helps with the itchiness. 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 Prescribing kenalog to be used for a week. Recommending continuing to moisturize with Vaseline. Follow up as needed.

## 2023-05-08 NOTE — Assessment & Plan Note (Signed)
Patient inquired about seeing an eye doctor. Has seen ophthalmology in the past so referral provided.

## 2023-05-08 NOTE — Patient Instructions (Signed)
It was great seeing you today!  I have sent in Triamcinolone cream for your rash to use twice a day for a week. Continue keeping skin well moisturized with vaseline twice a day.  I have sent in a medicine to use when you have a migraine. If you start to have more frequent migraines (occurring weekly) let us know and we can discuss different management.   I have referred you to our eye doctors. They will call you to schedule an appointment in the next couple of weeks.   Visit Reminders: - Stop by the pharmacy to pick up your prescriptions  - Continue to work on your healthy eating habits and incorporating exercise into your daily life.   Feel free to call with any questions or concerns at any time, at [phone].   Take care,  Dr. Cora Collum [facility]

## 2023-05-08 NOTE — Progress Notes (Signed)
    SUBJECTIVE:   CHIEF COMPLAINT / HPI:   Rebecca Blevins is a 52 yo female who presents to the clinic for a rash and headaches.  Rash Patient has had an itchy rash on extremities and torso for 3-4 days. She has had this before and believes it recurs when the weather is hot or cold. Benadryl cream used in past has helped the itch.  Headache Patient has had rare headaches for years. She says they are mainly on the R side of her head. Endorses visual changes, photophobia, and phonophobia. She has used ibuprofen in the past which helps some.  PERTINENT  PMH / PSH: Eczema, GERD  OBJECTIVE:   BP 121/80   Pulse 67   Ht 5\' 6"  (1.676 m)   Wt 168 lb 3.2 oz (76.3 kg)   LMP 04/17/2013   SpO2 100%   BMI 27.15 kg/m   Gen: alert, well appearing, in no acute distress HEENT: normocephalic, atraumatic CV: RRR, no MRG Neuro: Cranial Nerves: II: PERRL.  III,IV, VI: EOMI without ptosis or diplopia.  V: Facial sensation is symmetric to touch VII: Facial movement is symmetric.  VIII: hearing is intact to voice XI: Shoulder shrug is symmetric. Motor: Grip strength equal  Sensory: Sensation is symmetric to light touch in the arms and legs. Pulm: normal WOB, clear to auscultation bilaterally Ab: normoactive bowel sounds, no tenderness or masses Ext: no lower extremity edema   ASSESSMENT/PLAN:   Eczema Patient has itchy papules on extremities and torso that recur with hot or cold weather. She uses benadryl cream which helps with the itchiness. 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 Prescribing kenalog to be used for a week. Recommending continuing to moisturize with Vaseline. Follow up as needed.  Headache Patient describes symptoms consistent with migraines such as vision changes, photophobia, phonophobia, and unilateral pain. These happen infrequently. Prescribing sumatriptan as needed to stop headaches. If headaches become more frequent can consider prophylactic treatment. Follow up as  needed.  Blurry vision, left eye Patient inquired about seeing an eye doctor. Has seen ophthalmology in the past so referral provided.   Barrett Shell, Medical Student [facility]   I was personally present and performed or re-performed the history, physical exam and medical decision making activities of this service and have verified that the service and findings are accurately documented in the student's note.  Cora Collum, DO                  05/08/2023, 4:06 PM

## 2023-05-08 NOTE — Assessment & Plan Note (Signed)
Patient describes symptoms consistent with migraines such as vision changes, photophobia, phonophobia, and unilateral pain. These happen infrequently. Prescribing sumatriptan as needed to stop headaches. If headaches become more frequent can consider prophylactic treatment. Follow up as needed.

## 2023-05-29 ENCOUNTER — Ambulatory Visit (HOSPITAL_COMMUNITY)
Admission: EM | Admit: 2023-05-29 | Discharge: 2023-05-29 | Disposition: A | Discharge status: Home / Self Care | Payer: Self-pay | Attending: Family Medicine | Admitting: Family Medicine

## 2023-05-29 ENCOUNTER — Encounter (HOSPITAL_COMMUNITY): Payer: Self-pay

## 2023-05-29 ENCOUNTER — Ambulatory Visit (INDEPENDENT_AMBULATORY_CARE_PROVIDER_SITE_OTHER): Payer: Self-pay

## 2023-05-29 DIAGNOSIS — M722 Plantar fascial fibromatosis: Secondary | ICD-10-CM

## 2023-05-29 DIAGNOSIS — M79671 Pain in right foot: Secondary | ICD-10-CM

## 2023-05-29 DIAGNOSIS — M25571 Pain in right ankle and joints of right foot: Secondary | ICD-10-CM

## 2023-05-29 MED ORDER — IBUPROFEN 800 MG PO TABS: 800.0000 mg | ORAL_TABLET | Freq: Three times a day (TID) | ORAL | 0 refills | Status: AC | PRN

## 2023-05-29 NOTE — Discharge Instructions (Signed)
Your x-rays do not show any acute bony problem.  There is a little bit of spurring in the heel, but that is not usually a cause of the pain you are describing.  Do the heel stretches, get heel cup inserts, and you can ice your heels.  Please follow-up with your primary care

## 2023-05-29 NOTE — ED Triage Notes (Signed)
Pt presents to the office for R-foot pain x 2 weeks. Pt reports her foot is swollen. Pt denies any trauma to her foot.
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
## 2023-06-02 ENCOUNTER — Encounter: Payer: Self-pay | Admitting: Family Medicine

## 2023-06-02 ENCOUNTER — Ambulatory Visit (INDEPENDENT_AMBULATORY_CARE_PROVIDER_SITE_OTHER): Payer: PRIVATE HEALTH INSURANCE | Admitting: Family Medicine

## 2023-06-02 VITALS — BP 110/62 | HR 77 | Ht 66.0 in | Wt 167.0 lb

## 2023-06-02 DIAGNOSIS — L309 Dermatitis, unspecified: Secondary | ICD-10-CM

## 2023-06-02 DIAGNOSIS — M722 Plantar fascial fibromatosis: Secondary | ICD-10-CM | POA: Diagnosis not present

## 2023-06-02 DIAGNOSIS — Z1211 Encounter for screening for malignant neoplasm of colon: Secondary | ICD-10-CM | POA: Diagnosis not present

## 2023-06-02 NOTE — Patient Instructions (Signed)
It was great seeing you today!  Im sorry you have been experiencing foot pain which is likely due to plantar fasciitis. Continue icing and doing the stretches you were given. I have referred you to sports medicine clinic so they should call you in the next couple of weeks to schedule that.   For the itching and rash continue keeping skin well moisturized with Vaseline or moisturizing lotion at least twice a day and can use the steroid ointment on the worst areas under the Vaseline twice a day for a week at a time.  I also placed order for your colonoscopy for your colon cancer screening, you can call to schedule an appointment to get that done  Please check-out at the front desk before leaving the clinic. Schedule to be seen in our derm clinic, but if you need to be seen earlier than that for any new issues we're happy to fit you in, just give [place] a call!  Feel free to call with any questions or concerns at any time, at [phone].   Take care,  Dr. Cora Collum [facility]  Plantar Fasciitis  Plantar fasciitis is a painful foot condition that affects the heel. It occurs when the band of tissue that connects the toes to the heel bone (plantar fascia) becomes irritated. This can happen as the result of exercising too much or doing other repetitive activities (overuse injury). Plantar fasciitis can cause mild irritation to severe pain that makes it difficult to walk or move. The pain is usually worse in the morning after sleeping, or after sitting or lying down for a period of time. Pain may also be worse after long periods of walking or standing. What are the causes? This condition may be caused by: Standing for long periods of time. Wearing shoes that do not have good arch support. Doing activities that put stress on joints (high-impact activities). This includes ballet and exercise that makes your heart beat faster (aerobic exercise), such as running. Being  overweight. An abnormal way of walking (gait). Tight muscles in the back of your lower leg (calf). High arches in your feet or flat feet. Starting a new athletic activity. What are the signs or symptoms? The main symptom of this condition is heel pain. Pain may get worse after the following: Taking the first steps after a time of rest, especially in the morning after awakening, or after you have been sitting or lying down for a while. Long periods of standing still. Pain may decrease after 30-45 minutes of activity, such as gentle walking. How is this diagnosed? This condition may be diagnosed based on your medical history, a physical exam, and your symptoms. Your health care provider will check for: A tender area on the bottom of your foot. A high arch in your foot or flat feet. Pain when you move your foot. Difficulty moving your foot. You may have imaging tests to confirm the diagnosis, such as: X-rays. Ultrasound. MRI. How is this treated? Treatment for plantar fasciitis depends on how severe your condition is. Treatment may include: Rest, ice, pressure (compression), and raising (elevating) the affected foot. This is called RICE therapy. Your health care provider may recommend RICE therapy along with over-the-counter pain medicines to manage your pain. Exercises to stretch your calves and your plantar fascia. A splint that holds your foot in a stretched, upward position while you sleep (night splint). Physical therapy to relieve symptoms and prevent problems in the future. Injections of  steroid medicine (cortisone) to relieve pain and inflammation. Stimulating your plantar fascia with electrical impulses (extracorporeal shock wave therapy). This is usually the last treatment option before surgery. Surgery, if other treatments have not worked after 12 months. Follow these instructions at home: Managing pain, stiffness, and swelling  If directed, put ice on the painful area. To do  this: Put ice in a plastic bag, or use a frozen bottle of water. Place a towel between your skin and the bag or bottle. Roll the bottom of your foot over the bag or bottle. Do this for 20 minutes, 2-3 times a day. Wear athletic shoes that have air-sole or gel-sole cushions, or try soft shoe inserts that are designed for plantar fasciitis. Elevate your foot above the level of your heart while you are sitting or lying down. Activity Avoid activities that cause pain. Ask your health care provider what activities are safe for you. Do physical therapy exercises and stretches as told by your health care provider. Try activities and forms of exercise that are easier on your joints (low impact). Examples include swimming, water aerobics, and biking. General instructions Take over-the-counter and prescription medicines only as told by your health care provider. Wear a night splint while sleeping, if told by your health care provider. Loosen the splint if your toes tingle, become numb, or turn cold and blue. Maintain a healthy weight, or work with your health care provider to lose weight as needed. Keep all follow-up visits. This is important. Contact a health care provider if you have: Symptoms that do not go away with home treatment. Pain that gets worse. Pain that affects your ability to move or do daily activities. Summary Plantar fasciitis is a painful foot condition that affects the heel. It occurs when the band of tissue that connects the toes to the heel bone (plantar fascia) becomes irritated. Heel pain is the main symptom of this condition. It may get worse after exercising too much or standing still for a long time. Treatment varies, but it usually starts with rest, ice, pressure (compression), and raising (elevating) the affected foot. This is called RICE therapy. Over-the-counter medicines can also be used to manage pain. This information is not intended to replace advice given to you by  your health care provider. Make sure you discuss any questions you have with your health care provider. Document Revised: 03/23/2020 Document Reviewed: 03/23/2020 Elsevier Patient Education  2024 [employer].

## 2023-06-02 NOTE — Progress Notes (Signed)
    SUBJECTIVE:   CHIEF COMPLAINT / HPI:   Patient presents for follow-up from urgent care visit when she was seen for right foot and ankle pain, presumed to be plantar fasciitis. Foot and ankle xray performed which did not show fracture or other acute findings but did show a heel spur.  She was given exercises to do and advised to follow up with sports medicnie. Today she states the pain started about 2 weeks ago and flares up. The pain is worse when she gets up from sitting or first thing in the morning and improves as she is on her feet. Has tried icing it, warm water soaks, stretches all without improvement.   PERTINENT  PMH / PSH: Reviewed   OBJECTIVE:   BP 110/62   Pulse 77   Ht 5\' 6"  (1.676 m)   Wt 167 lb (75.8 kg)   LMP 04/17/2013   SpO2 100%   BMI 26.95 kg/m    Physical exam General: well appearing, NAD Cardiovascular: RRR, no murmurs Lungs: CTAB. Normal WOB Abdomen: soft, non-distended, non-tender Skin: warm, dry. No edema Extremities: No erythema or deformities. TTP along heel and over the plantar fascia   ASSESSMENT/PLAN:   Plantar fasciitis of right foot Patient presents for follow up on plantar fasciitis. Exam significant for TTP along heel and over the plantar fascia. Recommended ice, exercises, pain medication prn. Was advised from UC to follow up with sports medicine. Placed referral. May benefit from night splints, injection, or other treatment modalities. Will follow up as needed.  Eczema Patient with diffuse pruritus not improved with topical steroids or vaseline. Unable to visualize rash today, recommended scheduling in derm clinic for further evaluation   Health maintenance Screening colonoscopy ordered   Cora Collum, DO [facility]

## 2023-06-03 DIAGNOSIS — M722 Plantar fascial fibromatosis: Secondary | ICD-10-CM | POA: Insufficient documentation

## 2023-06-03 NOTE — Assessment & Plan Note (Signed)
Patient with diffuse pruritus not improved with topical steroids or vaseline. Unable to visualize rash today, recommended scheduling in derm clinic for further evaluation

## 2023-06-03 NOTE — Assessment & Plan Note (Signed)
Patient presents for follow up on plantar fasciitis. Exam significant for TTP along heel and over the plantar fascia. Recommended ice, exercises, pain medication prn. Was advised from UC to follow up with sports medicine. Placed referral. May benefit from night splints, injection, or other treatment modalities. Will follow up as needed.

## 2023-06-04 ENCOUNTER — Other Ambulatory Visit: Payer: Self-pay | Admitting: Family Medicine

## 2023-06-07 ENCOUNTER — Ambulatory Visit (INDEPENDENT_AMBULATORY_CARE_PROVIDER_SITE_OTHER): Payer: PRIVATE HEALTH INSURANCE | Admitting: Student

## 2023-06-07 VITALS — BP 122/76 | HR 68 | Ht 66.0 in | Wt 170.2 lb

## 2023-06-07 DIAGNOSIS — L309 Dermatitis, unspecified: Secondary | ICD-10-CM

## 2023-06-07 MED ORDER — BETAMETHASONE VALERATE 0.1 % EX OINT: 1.0000 | TOPICAL_OINTMENT | Freq: Two times a day (BID) | CUTANEOUS | 0 refills | Status: DC

## 2023-06-07 NOTE — Patient Instructions (Addendum)
It was wonderful to meet you today. Thank you for allowing me to be a part of your care. Below is a short summary of what we discussed at your visit today:  I think the rash on your body is eczema  I have changed your treatment from the local ointments to the betamethasone or you treatment which you will apply daily, 5 days at a time and take 3 days off before applying again. Please do not use this in your pelvic/ genital area or face.  I also recommend use of Goldbond anti-itch body lotion which you would use in itchy areas, but don't apply it before you use the betamethasone cream.  I have also sent in referral for dermatologist.  This could take a couple of months before you are able to see them. You should receive a call from them to set up an appointment.  Is important that you keep your skin moisturized with Eucerin cream.  Other creams you can use include Vaseline or Aquaphor.  Please bring all of your medications to every appointment!  If you have any questions or concerns, please do not hesitate to contact us via phone or MyChart message.   Jerre Simon, MD [facility]

## 2023-06-07 NOTE — Progress Notes (Signed)
    SUBJECTIVE:   CHIEF COMPLAINT / HPI:   52 year old female presenting for worsening skin rash and itchiness. Rash started in 2008 after given birth She felt the rash started due to an injection she received during labor. Have tried lots of lotion including kenalog, Benadryl and CeraVe  (she's unsure of the name)with no significant improvement She has had associated hyperpigmentation in the affected area lower extremities, upper extremities and back. Has had prior referral to dermatology who recommended CeraVe (she's unsure if this is the cream) Patient only used Kenalog ointment for a week because she thinks it is worsening the itchiness.    PERTINENT  PMH / PSH: Reviewed  OBJECTIVE:   BP 122/76   Pulse 68   Ht 5\' 6"  (1.676 m)   Wt 170 lb 3.2 oz (77.2 kg)   LMP 04/17/2013   SpO2 100%   BMI 27.47 kg/m    Physical Exam General: Alert, well appearing, NAD Cardiovascular: Regular rate and rhythm, well-perfused Respiratory: Normal work of breathing on RA Skin: Hyperpigmented, macular, galea rash diffuse bilateral lower extremity, bilateral upper extremity and and back.   ASSESSMENT/PLAN:   Eczema Patient with  reported  pruritic rash on exam found to have hyperpigmented scaly rash suspicious of Atopic dermatitis. Other rashes in differential includes contact dermatitis, seborrheic dermatitis or Tinea.  Previously tried Kenalog 0.1% ointment with no improvement.  - Recommend continued use of emollient preferred Eucerin cream but can use Vaseline or Aquafor -Rx 0.1% betamethasone ointment 5 days at a time and 3-day break in between -Recommend Goldbond anti-itch cream for itchiness over affected area. -Placed Ambulatory dermatology referral.   Jerre Simon, MD [facility]

## 2023-06-14 ENCOUNTER — Ambulatory Visit: Payer: PRIVATE HEALTH INSURANCE | Admitting: Sports Medicine

## 2023-06-23 ENCOUNTER — Ambulatory Visit (INDEPENDENT_AMBULATORY_CARE_PROVIDER_SITE_OTHER): Payer: PRIVATE HEALTH INSURANCE | Admitting: Student

## 2023-06-23 VITALS — BP 112/70 | HR 68 | Ht 66.0 in | Wt 171.0 lb

## 2023-06-23 DIAGNOSIS — M722 Plantar fascial fibromatosis: Secondary | ICD-10-CM | POA: Diagnosis not present

## 2023-06-23 DIAGNOSIS — L309 Dermatitis, unspecified: Secondary | ICD-10-CM | POA: Diagnosis not present

## 2023-06-23 MED ORDER — CLOBETASOL PROPIONATE 0.05 % EX OINT: 1.0000 | TOPICAL_OINTMENT | Freq: Two times a day (BID) | CUTANEOUS | 1 refills | Status: DC

## 2023-06-23 NOTE — Patient Instructions (Signed)
It was great to see you today! Thank you for choosing [facility] for your primary care. Rebecca Blevins was seen for rash and plantar fasciitis.  Today we addressed: Rash: We are checking some labs today.  I would also like to get a biopsy of this, please return for this next week.  I have also prescribed you a very high dose steroid ointment to use in the meantime.  The prior ones you are using may not have been strong enough. Plantar fasciitis: I have placed a referral to physical therapy and sports medicine for this concern.  Another option in the future may be podiatry however they would very likely do an injection.  If you haven't already, sign up for My Chart to have easy access to your labs results, and communication with your primary care physician.  We are checking some labs today. If they are abnormal, I will call you. If they are normal, I will send you a MyChart message (if it is active) or a letter in the mail. If you do not hear about your labs in the next 2 weeks, please call the office.  You should return to our clinic Return in about 1 week (around 06/30/2023) for Biopsy. Please arrive 15 minutes before your appointment to ensure smooth check in process.  We appreciate your efforts in making this happen.  Thank you for allowing me to participate in your care, Rebecca Mattocks, DO 06/23/2023, 9:23 AM PGY-3, [facility]

## 2023-06-23 NOTE — Progress Notes (Signed)
  SUBJECTIVE:   CHIEF COMPLAINT / HPI:   Plantar fasciitis: worsened pain on the right foot. She has been unable to see sports medicine. It is worse in the morning. She is doing stretching in the morning and ice. She was using the ibuprofen that was prescribed to her.   Rash: suspected to be eczema but notes it is worse after using the steroid ointment (triamcinolone, betamethasone) that was prescribed to her on 06/07/23 by Dr. Elliot Blevins. Itchy and painful. Sometimes she will scratch until she bleeds. Right now it is okay, but it flares when she smells something odd. When it is hot or cold she starts to itch.   PERTINENT  PMH / PSH: Plantar fasciitis  Patient Care Team: Rebecca Mola, MD as PCP - General (Family Medicine) OBJECTIVE:  BP 112/70   Pulse 68   Ht 5\' 6"  (1.676 m)   Wt 171 lb (77.6 kg)   LMP 04/17/2013   BMI 27.60 kg/m  General: Well-appearing, NAD MSK: Elicited pain on the plantar surfaces of feet with toe extension bilaterally Skin: Hypertrophic and hyperpigmented changes of the left lower extremity and abdomen, excoriations present       ASSESSMENT/PLAN:  Eczema, unspecified type Assessment & Plan: Diffuse pruritus now with hypertrophic and hyperpigmented changes.  Low to medium topical steroids have not been beneficial.  Will trial high-dose topical steroids.  I do question whether or not this is truly eczema as it is widespread and not on usual eczematous regions.  Given temperature change trigger, I have concern for polycythemia vera.  Will also investigate for liver/kidney pathology.  Recommend biopsy on follow-up.  Orders: -     Clobetasol Propionate; Apply 1 Application topically 2 (two) times daily. For very severe eczema.  Do not use for more than 1 week at a time.  Dispense: 60 g; Refill: 1 -     CBC with Differential/Platelet -     Comprehensive metabolic panel  Plantar fasciitis Assessment & Plan: Now bilateral, has been unable to be set up with sports  medicine.  I have recommended physical therapy, I have placed another sports medicine referral at their request.  Another option would be podiatry however I believe they would recommend injection and hoping to avoid this if she can get resolved with strictly physical therapeutic benefits.  Orders: -     Ambulatory referral to Physical Therapy -     Ambulatory referral to Sports Medicine   Return in about 1 week (around 06/30/2023) for Biopsy. Rebecca Mattocks, DO 06/23/2023, 9:25 AM PGY-3, [facility]

## 2023-06-23 NOTE — Assessment & Plan Note (Addendum)
Now bilateral, has been unable to be set up with sports medicine.  I have recommended physical therapy, I have placed another sports medicine referral at their request.  Another option would be podiatry however I believe they would recommend injection and hoping to avoid this if she can get resolved with strictly physical therapeutic benefits.

## 2023-06-23 NOTE — Assessment & Plan Note (Signed)
Diffuse pruritus now with hypertrophic and hyperpigmented changes.  Low to medium topical steroids have not been beneficial.  Will trial high-dose topical steroids.  I do question whether or not this is truly eczema as it is widespread and not on usual eczematous regions.  Given temperature change trigger, I have concern for polycythemia vera.  Will also investigate for liver/kidney pathology.  Recommend biopsy on follow-up.

## 2023-06-24 LAB — CBC WITH DIFFERENTIAL/PLATELET
Basophils Absolute: 0 10*3/uL (ref 0.0–0.2)
Basos: 1 %
EOS (ABSOLUTE): 0.9 10*3/uL — ABNORMAL HIGH (ref 0.0–0.4)
Eos: 20 %
Hematocrit: 33.9 % — ABNORMAL LOW (ref 34.0–46.6)
Hemoglobin: 11.4 g/dL (ref 11.1–15.9)
Immature Grans (Abs): 0 10*3/uL (ref 0.0–0.1)
Immature Granulocytes: 0 %
Lymphocytes Absolute: 1.2 10*3/uL (ref 0.7–3.1)
Lymphs: 28 %
MCH: 29.8 pg (ref 26.6–33.0)
MCHC: 33.6 g/dL (ref 31.5–35.7)
MCV: 89 fL (ref 79–97)
Monocytes Absolute: 0.5 10*3/uL (ref 0.1–0.9)
Monocytes: 12 %
Neutrophils Absolute: 1.8 10*3/uL (ref 1.4–7.0)
Neutrophils: 39 %
Platelets: 361 10*3/uL (ref 150–450)
RBC: 3.83 x10E6/uL (ref 3.77–5.28)
RDW: 12.8 % (ref 11.7–15.4)
WBC: 4.4 10*3/uL (ref 3.4–10.8)

## 2023-06-24 LAB — COMPREHENSIVE METABOLIC PANEL
ALT: 13 IU/L (ref 0–32)
AST: 19 IU/L (ref 0–40)
Albumin: 4.2 g/dL (ref 3.8–4.9)
Alkaline Phosphatase: 82 IU/L (ref 44–121)
BUN/Creatinine Ratio: 22 (ref 9–23)
BUN: 14 mg/dL (ref 6–24)
Bilirubin Total: <0.2 mg/dL (ref 0.0–1.2)
CO2: 24 mmol/L (ref 20–29)
Calcium: 9.4 mg/dL (ref 8.7–10.2)
Chloride: 103 mmol/L (ref 96–106)
Creatinine, Ser: 0.64 mg/dL (ref 0.57–1.00)
Globulin, Total: 2.7 g/dL (ref 1.5–4.5)
Glucose: 110 mg/dL — ABNORMAL HIGH (ref 70–99)
Potassium: 4.4 mmol/L (ref 3.5–5.2)
Sodium: 140 mmol/L (ref 134–144)
Total Protein: 6.9 g/dL (ref 6.0–8.5)
eGFR: 106 mL/min/{1.73_m2} (ref >59)

## 2023-06-30 NOTE — Progress Notes (Signed)
  SUBJECTIVE:   CHIEF COMPLAINT / HPI:   Presenting today for follow-up of rash.  Discussed at last visit that biopsy would be appropriate.  She is amenable to biopsy today.  She notes her abdomen is greatly improved with clobetasol although she is now having breakout and itching on her right lower extremity.  PERTINENT  PMH / PSH: Eczema  Patient Care Team: Lockie Mola, MD as PCP - General (Family Medicine) OBJECTIVE:  BP 112/69   Pulse 66   Ht 5\' 6"  (1.676 m)   Wt 171 lb (77.6 kg)   LMP 04/17/2013   SpO2 99%   BMI 27.60 kg/m  Skin: Hypertrophic and hyperpigmented changes of the left lower/upper extremity and right lower extremity and abdomen, excoriations present     ASSESSMENT/PLAN:  Rash and nonspecific skin eruption Assessment & Plan: Seems to be improved being, discussed recent labs.  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  Pre-op Diagnosis: Rash of unknown etiology Post-op Diagnosis: Same Procedure: Punch Skin Biopsy Location: Right lateral lower leg Performing Physician: [name], DO Supervising Physician (if applicable): N/A  Informed consent was obtained prior to the procedure. Time out performed. The area surrounding the skin lesion was prepared and cleaned with povidone-iodine swab stick and wiped clean with alcohol prep. The area was sufficiently anesthetized with 1% Lidocaine with epinephrine.  A size 3mm disposable biopsy punch tool was used to obtain tissue sample and placed in labeled biopsy cup. Silver nitrate was used to obtain hemostasis. The site was not closed. The patient tolerated the procedure well without complications.  Standard post-procedure care was explained and return precautions and wound care handout given.   Orders: -     Surgical pathology  [name], DO 07/03/2023, 12:02 PM PGY-3, [facility]

## 2023-07-03 ENCOUNTER — Ambulatory Visit: Payer: PRIVATE HEALTH INSURANCE | Admitting: Student

## 2023-07-03 ENCOUNTER — Encounter: Payer: Self-pay | Admitting: Student

## 2023-07-03 ENCOUNTER — Other Ambulatory Visit: Payer: Self-pay | Admitting: Family Medicine

## 2023-07-03 VITALS — BP 112/69 | HR 66 | Ht 66.0 in | Wt 171.0 lb

## 2023-07-03 DIAGNOSIS — R21 Rash and other nonspecific skin eruption: Secondary | ICD-10-CM | POA: Diagnosis not present

## 2023-07-03 NOTE — Assessment & Plan Note (Signed)
Seems to be improved being, discussed recent labs.  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  Pre-op Diagnosis: Rash of unknown etiology Post-op Diagnosis: Same Procedure: Punch Skin Biopsy Location: Right lateral lower leg Performing Physician: [name], DO Supervising Physician (if applicable): N/A  Informed consent was obtained prior to the procedure. Time out performed. The area surrounding the skin lesion was prepared and cleaned with povidone-iodine swab stick and wiped clean with alcohol prep. The area was sufficiently anesthetized with 1% Lidocaine with epinephrine.  A size 3mm disposable biopsy punch tool was used to obtain tissue sample and placed in labeled biopsy cup. Silver nitrate was used to obtain hemostasis. The site was not closed. The patient tolerated the procedure well without complications.  Standard post-procedure care was explained and return precautions and wound care handout given.

## 2023-07-03 NOTE — Patient Instructions (Signed)
We did a punch biopsy of your right lower leg.  Keep this area clean and dry for 24 hours with the bandage still intact.  You may take off the bandage afterwards and apply Vaseline several times a day and whenever bathing and just allow soapy water to run over it.  Should you notice any warmth or excess bleeding in that area or pain, that may be a sign of infection and I would recommend you come back.  However, Vaseline should work well and there is no need for antibiotic ointment at this time.  We do not place any sutures because the biopsy site was so small.

## 2023-07-10 ENCOUNTER — Telehealth: Payer: Self-pay

## 2023-07-10 NOTE — Telephone Encounter (Signed)
Patients daughter calls nurse line requesting results from recent biopsy.   Will forward to provider who saw patient.   She asks you call her with any result information due to language barrier.   [phone].

## 2023-07-12 ENCOUNTER — Ambulatory Visit (INDEPENDENT_AMBULATORY_CARE_PROVIDER_SITE_OTHER): Payer: PRIVATE HEALTH INSURANCE

## 2023-07-12 VITALS — BP 125/81 | HR 60 | Ht 66.0 in | Wt 173.0 lb

## 2023-07-12 DIAGNOSIS — L299 Pruritus, unspecified: Secondary | ICD-10-CM | POA: Diagnosis not present

## 2023-07-12 DIAGNOSIS — L309 Dermatitis, unspecified: Secondary | ICD-10-CM | POA: Diagnosis not present

## 2023-07-12 MED ORDER — CETIRIZINE HCL 10 MG PO TABS: 10.0000 mg | ORAL_TABLET | Freq: Every day | ORAL | 11 refills | Status: AC

## 2023-07-12 MED ORDER — CLOBETASOL PROPIONATE 0.05 % EX OINT: 1.0000 | TOPICAL_OINTMENT | CUTANEOUS | 1 refills | Status: DC

## 2023-07-12 NOTE — Patient Instructions (Signed)
It was great to see you! Thank you for allowing me to participate in your care!   I recommend that you always bring your medications to each appointment as this makes it easy to ensure we are on the correct medications and helps [place] not miss when refills are needed.  Our plans for today:  - Use the ointment twice daily, for 3 days out of the week. - Take 10 mg zyrtec every day, you can take 20 mg daily if needed for itching - I have sent referrals to Allergy and Dermatology  Take care and seek immediate care sooner if you develop any concerns. Please remember to show up 15 minutes before your scheduled appointment time!  Tiffany Kocher, DO [facility]

## 2023-07-12 NOTE — Progress Notes (Signed)
    SUBJECTIVE:   CHIEF COMPLAINT / HPI:   Rash: Longstanding rash for many years.  Most recent interval history includes: Started on triamcinolone, betamethasone which was prescribed to her on 06/07/2023 by Dr. Alice Rieger.  The rash is itchy and sometimes painful, she scratches the rash until it bleeds.  Notes it flares when it is hot or cold.  She was prescribed clobetasol on 06/23/2023 by Dr. Royal Piedra.  Additionally biopsy was obtained on 07/03/2023 by Dr. Zenon Mayo, which showed hypersensitivity reaction-these results were discussed today with the patient.  At this time it is still unclear what her skin is reacting to.  She reports fragrance free detergents and lotions.  No new medications, no pets.  No joint pain, no systemic illness, no fevers, no mouth sores, no face or tongue swelling.  Of note patient has prior visit 6 months ago for urticaria requiring IM Solu-Medrol and received prednisone taper for 6 days in addition to Benadryl.   OBJECTIVE:   BP 125/81   Pulse 60   Ht 5\' 6"  (1.676 m)   Wt 173 lb (78.5 kg)   LMP 04/17/2013   SpO2 100%   BMI 27.92 kg/m    General: NAD, well-appearing, well-nourished Respiratory: No respiratory distress, breathing comfortably, able to speak in full sentences Skin: warm and dry.  Hypertrophic and hyperpigmented changes of left lower extremity and abdomen, excoriations present. Psych: Appropriate affect and mood    ASSESSMENT/PLAN:   Eczema Refractory to moderate/high potency topical steroids.  Notices mild relief of symptoms with topical steroids.  Primary cc of pruritus waking her at night.  Biopsy showing hypersensitivity reaction, unclear cause. - Trial Zyrtec for pruritus - Clobetasol 3 days/week - OTC benzocaine - Referral to Allergy and Dermatology   Follow-up recommendations Consider hydroxyzine to assist with itching at night, as patient is being woken from sleep due to pruritus.  Tiffany Kocher, DO [facility]

## 2023-07-12 NOTE — Assessment & Plan Note (Signed)
Refractory to moderate/high potency topical steroids.  Notices mild relief of symptoms with topical steroids.  Primary cc of pruritus waking her at night.  Biopsy showing hypersensitivity reaction, unclear cause. - Trial Zyrtec for pruritus - Clobetasol 3 days/week - OTC benzocaine - Referral to Allergy and Dermatology

## 2023-07-13 NOTE — Telephone Encounter (Signed)
Called back, unable to reach patient's daughter however was able to leave voice message.  Noted that the biopsy was reflective of an allergic reaction.  Advised to call back if this allergic reaction is not continuing to improve and we may need to rediscuss the treatment plan or send to dermatology.

## 2023-08-08 ENCOUNTER — Other Ambulatory Visit: Payer: Self-pay

## 2023-08-09 MED ORDER — SUMATRIPTAN SUCCINATE 25 MG PO TABS: 25.0000 mg | ORAL_TABLET | ORAL | 1 refills | Status: AC | PRN

## 2023-09-20 ENCOUNTER — Encounter: Payer: Self-pay | Admitting: Allergy

## 2023-09-20 ENCOUNTER — Ambulatory Visit (INDEPENDENT_AMBULATORY_CARE_PROVIDER_SITE_OTHER): Payer: No Typology Code available for payment source | Admitting: Allergy

## 2023-09-20 VITALS — BP 118/70 | HR 69 | Temp 97.6°F | Resp 16 | Ht 66.0 in | Wt 181.1 lb

## 2023-09-20 DIAGNOSIS — L2084 Intrinsic (allergic) eczema: Secondary | ICD-10-CM | POA: Diagnosis not present

## 2023-09-20 MED ORDER — BETAMETHASONE VALERATE 0.1 % EX OINT: 1.0000 | TOPICAL_OINTMENT | Freq: Two times a day (BID) | CUTANEOUS | 5 refills | Status: AC | PRN

## 2023-09-20 MED ORDER — PIMECROLIMUS 1 % EX CREA: TOPICAL_CREAM | Freq: Two times a day (BID) | CUTANEOUS | 5 refills | Status: AC | PRN

## 2023-09-20 NOTE — Progress Notes (Signed)
New Patient Note  RE: Rebecca Blevins MRN: [id number] DOB: [birth year] Date of Office Visit: 09/20/2023   Primary care provider: Lockie Mola, MD  Chief Complaint: allergies  History of present illness: Rebecca Blevins is a 52 y.o. female presenting today for evaluation of itching, rash.   Discussed the use of AI scribe software for clinical note transcription with the patient, who gave verbal consent to proceed.  She has had a persistent, itchy rash. The rash, initially diagnosed as a form of eczema by the primary care provider, has been present since 2008, starting on the legs and gradually spreading to other parts of the body. The patient describes the rash as starting as a small bump, which grows larger with scratching, and eventually darkens the skin. The rash is described as very itchy, with a sensation of heat upon scratching. The patient also reports that the rash moves around, not staying in the same place.  She now has involvement of her legs, arms, chest, back and spreading to face.  The rash has been particularly severe this year, affecting the patient all year round. The patient has noticed that extremes of temperature, both hot and cold, seem to exacerbate the rash. The patient has tried to identify potential food triggers but has not been successful.  In terms of treatment, the patient has tried clobetasol ointment, a steroid cream, which provided temporary relief but the rash returned worse than before. The patient has also been taking two Zyrtec pills daily, one in the morning and one at night, which has helped to alleviate the itchiness. The patient also received a steroid shot in 2015, which helped to reduce the rash temporarily.  The patient's eosinophil level was found to be high in a recent blood test, suggesting a possible allergy-related issue.     She had a biopsy from July 2024 - dermal mixed inflammatory infiltrate of lymphocytes, histiocytes and scattered  eosinophils. This pattern is that of a dermal hypersensitivity reaction and may be the result of exogenous stimuli (such as an arthropod bite) or endogenous stimuli (such as a drug). Following review of the hematoxylin and eosin sections, a PAS stain was obtained to exclude a fungal infection. The PAS stain is negative for fungal organisms.  Review of systems: 10pt ROS negative unless noted above in HPI  Past medical history: Past Medical History:  Diagnosis Date   Eczema    Migraine    Plantar fasciitis     Past surgical history: Past Surgical History:  Procedure Laterality Date   UVULECTOMY     as a child in [place]    Family history:  Family History  Problem Relation Age of Onset   Asthma Mother    Allergic rhinitis Neg Hx    Eczema Neg Hx    Urticaria Neg Hx     Social history: Lives in a home without carpeting with electric heating and central cooling.  Cat in the home.  There is no concern for water damage, roaches or mildew in the home.  She is a Advertising copywriter.  She denies a smoking history.   Medication List: Current Outpatient Medications  Medication Sig Dispense Refill   betamethasone valerate ointment (VALISONE) 0.1 % Apply 1 Application topically 2 (two) times daily as needed. 45 g 5   cetirizine (ZYRTEC) 10 MG tablet Take 1 tablet (10 mg total) by mouth daily. 30 tablet 11   ibuprofen (ADVIL) 800 MG tablet Take 1 tablet (800 mg total) by mouth  every 8 (eight) hours as needed (pain). 21 tablet 0   pimecrolimus (ELIDEL) 1 % cream Apply topically 2 (two) times daily as needed. 30 g 5   SUMAtriptan (IMITREX) 25 MG tablet Take 1 tablet (25 mg total) by mouth every 2 (two) hours as needed for migraine. May repeat in 2 hours if headache persists or recurs not to exceed 200mg  in 24 hours 9 tablet 1   No current facility-administered medications for this visit.    Known medication allergies: Allergies  Allergen Reactions   Pork-Derived Products Other (See Comments)     No pork for religious reasons     Physical examination: Blood pressure 118/70, pulse 69, temperature 97.6 F (36.4 C), temperature source Temporal, resp. rate 16, height 5\' 6"  (1.676 m), weight 181 lb 1.6 oz (82.1 kg), last menstrual period 04/17/2013, SpO2 98%.  General: Alert, interactive, in no acute distress. HEENT: PERRLA, TMs pearly gray, turbinates non-edematous without discharge, post-pharynx non erythematous. Neck: Supple without lymphadenopathy. Lungs: Clear to auscultation without wheezing, rhonchi or rales. {no increased work of breathing. CV: Normal S1, S2 without murmurs. Abdomen: Nondistended, nontender. Skin: Hyperpigmented, lichenified patches on the upper chest and breast area, lower legs anterior> posterior, bilateral forearms extending to antecubital fossa, upper and mid back Extremities:  No clubbing, cyanosis or edema. Neuro:   Grossly intact.  Diagnositics/Labs: Labs:  Component     Latest Ref Rng 06/23/2023  WBC     3.4 - 10.8 x10E3/uL 4.4   RBC     3.77 - 5.28 x10E6/uL 3.83   Hemoglobin     11.1 - 15.9 g/dL 16.1   HCT     09.6 - 04.5 % 33.9 (L)   MCV     79 - 97 fL 89   MCH     26.6 - 33.0 pg 29.8   MCHC     31.5 - 35.7 g/dL 40.9   RDW     81.1 - 91.4 % 12.8   Platelets     150 - 450 x10E3/uL 361   Neutrophils     Not Estab. % 39   Lymphs     Not Estab. % 28   Monocytes     Not Estab. % 12   Eos     Not Estab. % 20   Basos     Not Estab. % 1   NEUT#     1.4 - 7.0 x10E3/uL 1.8   Lymphocyte #     0.7 - 3.1 x10E3/uL 1.2   Monocytes Absolute     0.1 - 0.9 x10E3/uL 0.5   EOS (ABSOLUTE)     0.0 - 0.4 x10E3/uL 0.9 (H)   Basophils Absolute     0.0 - 0.2 x10E3/uL 0.0   Immature Granulocytes     Not Estab. % 0   Immature Grans (Abs)     0.0 - 0.1 x10E3/uL 0.0   Glucose     70 - 99 mg/dL 782 (H)   BUN     6 - 24 mg/dL 14   Creatinine     9.56 - 1.00 mg/dL 2.13   eGFR     >08 MV/HQI/6.96 106   BUN/Creatinine Ratio     9 - 23  22    Sodium     134 - 144 mmol/L 140   Potassium     3.5 - 5.2 mmol/L 4.4   Chloride     96 - 106 mmol/L 103   CO2  20 - 29 mmol/L 24   Calcium     8.7 - 10.2 mg/dL 9.4   Total Protein     6.0 - 8.5 g/dL 6.9   Albumin     3.8 - 4.9 g/dL 4.2   Globulin, Total     1.5 - 4.5 g/dL 2.7   Total Bilirubin     0.0 - 1.2 mg/dL <4.6   Alkaline Phosphatase     44 - 121 IU/L 82   AST     0 - 40 IU/L 19   ALT     0 - 32 IU/L 13    Assessment and plan: Eczema Chronic, severe, and widespread pruritus with skin discoloration. Previous treatment with clobetasol ointment and a steroid injection provided minimal relief. Zyrtec 10mg  twice daily provides some relief. Elevated eosinophil count suggestive of an allergic component. -Obtain additional labs to assess for common environmental and food allergens, as well as thyroid function and autoimmune screen. -Use Elidel or Protopic, a non-steroid ointment, twice a day as needed for itch/rash.  Can use anywhere on body.  -Use Betamethasone, a strong steroid ointment, twice a day as needed for itch/rash on body.  Do not use on face.  -Consider Dupixent as a potential treatment option. Initiate approval process with insurance and provide patient with information about Dupixent.  Dupixent benefits, risk and protocol provided.  Will have Tammy, our nurse coordinator, call you in regards to starting Dupixent.  Informational brochure provide.d   -Follow-up in 2-3 months.  I appreciate the opportunity to take part in Rebecca Blevins's care. Please do not hesitate to contact me with questions.  Sincerely,   Margo Aye, MD Allergy/Immunology [facility]

## 2023-09-20 NOTE — Patient Instructions (Signed)
Eczema Chronic, severe, and widespread pruritus with skin discoloration. Previous treatment with clobetasol ointment and a steroid injection provided minimal relief. Zyrtec 10mg  twice daily provides some relief. Elevated eosinophil count suggestive of an allergic component. -Obtain additional labs to assess for common environmental and food allergens, as well as thyroid function and autoimmune screen. -Use Elidel or Protopic, a non-steroid ointment, twice a day as needed for itch/rash.  Can use anywhere on body.  -Use Betamethasone, a strong steroid ointment, twice a day as needed for itch/rash on body.  Do not use on face.  -Consider Dupixent as a potential treatment option. Initiate approval process with insurance and provide patient with information about Dupixent.  Dupixent benefits, risk and protocol provided.  Will have Tammy, our nurse coordinator, call you in regards to starting Dupixent.  Informational brochure provide.d   -Follow-up in 2-3 months.

## 2023-09-22 LAB — ALLERGEN, WHEAT, F4: Wheat IgE: <0.1 kU/L

## 2023-09-22 LAB — ALLERGENS W/TOTAL IGE AREA 2
Alternaria Alternata IgE: <0.1 kU/L
Aspergillus Fumigatus IgE: <0.1 kU/L
Bermuda Grass IgE: 1.04 kU/L — AB
Cat Dander IgE: <0.1 kU/L
Cedar, Mountain IgE: 0.13 kU/L — AB
Cladosporium Herbarum IgE: <0.1 kU/L
Cockroach, German IgE: 0.97 kU/L — AB
Common Silver Birch IgE: 0.12 kU/L — AB
Cottonwood IgE: 0.2 kU/L — AB
D Farinae IgE: 5.46 kU/L — AB
D Pteronyssinus IgE: 5.27 kU/L — AB
Dog Dander IgE: <0.1 kU/L
Elm, American IgE: 0.12 kU/L — AB
Johnson Grass IgE: 1.32 kU/L — AB
Maple/Box Elder IgE: <0.1 kU/L
Mouse Urine IgE: <0.1 kU/L
Oak, White IgE: 0.96 kU/L — AB
Pecan, Hickory IgE: 0.36 kU/L — AB
Penicillium Chrysogen IgE: <0.1 kU/L
Pigweed, Rough IgE: <0.1 kU/L
Ragweed, Short IgE: 0.14 kU/L — AB
Sheep Sorrel IgE Qn: <0.1 kU/L
Timothy Grass IgE: 3.72 kU/L — AB
White Mulberry IgE: <0.1 kU/L

## 2023-09-22 LAB — TSH: TSH: 1.29 u[IU]/mL (ref 0.450–4.500)

## 2023-09-22 LAB — IGE NUT PROF. W/COMPONENT RFLX
F017-IgE Hazelnut (Filbert): <0.1 kU/L
F018-IgE Brazil Nut: <0.1 kU/L
F020-IgE Almond: <0.1 kU/L
F202-IgE Cashew Nut: <0.1 kU/L
F203-IgE Pistachio Nut: <0.1 kU/L
F256-IgE Walnut: <0.1 kU/L
Macadamia Nut, IgE: <0.1 kU/L
Peanut, IgE: <0.1 kU/L
Pecan Nut IgE: <0.1 kU/L

## 2023-09-22 LAB — ALLERGEN PROFILE, FOOD-FISH
Allergen Mackerel IgE: <0.1 kU/L
Allergen Salmon IgE: <0.1 kU/L
Allergen Trout IgE: <0.1 kU/L
Allergen Walley Pike IgE: <0.1 kU/L
Codfish IgE: <0.1 kU/L
Halibut IgE: <0.1 kU/L
Tuna: <0.1 kU/L

## 2023-09-22 LAB — ALPHA-GAL PANEL
Allergen Lamb IgE: <0.1 kU/L
Beef IgE: <0.1 kU/L
IgE (Immunoglobulin E), Serum: 114 [IU]/mL (ref 6–495)
O215-IgE Alpha-Gal: <0.1 kU/L
Pork IgE: <0.1 kU/L

## 2023-09-22 LAB — ALLERGEN PROFILE, SHELLFISH
Clam IgE: <0.1 kU/L
F023-IgE Crab: <0.1 kU/L
F080-IgE Lobster: <0.1 kU/L
F290-IgE Oyster: <0.1 kU/L
Scallop IgE: <0.1 kU/L
Shrimp IgE: 1.03 kU/L — AB

## 2023-09-22 LAB — ALLERGEN EGG WHITE F1: Egg White IgE: <0.1 kU/L

## 2023-09-22 LAB — ALLERGEN SOYBEAN: Soybean IgE: <0.1 kU/L

## 2023-09-22 LAB — TRYPTASE: Tryptase: 3.8 ug/L (ref 2.2–13.2)

## 2023-09-22 LAB — ALLERGEN MILK: Milk IgE: <0.1 kU/L

## 2023-09-22 LAB — ANA W/REFLEX: Anti Nuclear Antibody (ANA): NEGATIVE

## 2023-09-25 ENCOUNTER — Telehealth: Payer: Self-pay

## 2023-09-25 NOTE — Telephone Encounter (Signed)
Received faxed Dermatopathology Report  - DOB verified.  Report placed in provider's in basket for review.  Forwarding message to provider as update

## 2023-09-26 ENCOUNTER — Encounter: Payer: Self-pay | Admitting: Allergy

## 2023-09-26 MED ORDER — EPINEPHRINE 0.3 MG/0.3ML IJ SOAJ: 0.3000 mg | INTRAMUSCULAR | 2 refills | Status: AC | PRN

## 2023-09-26 NOTE — Addendum Note (Signed)
Addended by: Elsworth Soho on: 09/26/2023 12:01 PM   Modules accepted: Orders

## 2023-11-06 ENCOUNTER — Ambulatory Visit (INDEPENDENT_AMBULATORY_CARE_PROVIDER_SITE_OTHER): Payer: No Typology Code available for payment source

## 2023-11-06 ENCOUNTER — Encounter: Payer: Self-pay | Admitting: Podiatry

## 2023-11-06 ENCOUNTER — Ambulatory Visit (INDEPENDENT_AMBULATORY_CARE_PROVIDER_SITE_OTHER): Payer: No Typology Code available for payment source | Admitting: Podiatry

## 2023-11-06 VITALS — Ht 66.0 in | Wt 181.1 lb

## 2023-11-06 DIAGNOSIS — M722 Plantar fascial fibromatosis: Secondary | ICD-10-CM

## 2023-11-06 DIAGNOSIS — M79671 Pain in right foot: Secondary | ICD-10-CM | POA: Diagnosis not present

## 2023-11-06 MED ORDER — METHYLPREDNISOLONE 4 MG PO TBPK: ORAL_TABLET | ORAL | 0 refills | Status: DC

## 2023-11-06 NOTE — Progress Notes (Unsigned)
Chief Complaint  Patient presents with   Plantar Fasciitis    Pt is here for plantar pasciitis in the bottom of the right foot. Pt states it is painful to walk in the mornings and after sitting for a while.   HPI: 52 y.o. female presenting today with c/o pain in the bottom of the right heel.  Denies injury.  Notes pain in the morning when first getting out of bed to walk.  Pain is now starting to wrap around the back of the heel.  Has not sought treatment elsewhere for this yet.  Past Medical History:  Diagnosis Date   Eczema    Migraine    Plantar fasciitis     Past Surgical History:  Procedure Laterality Date   UVULECTOMY     as a child in [place]    Allergies  Allergen Reactions   Pork-Derived Products Other (See Comments)    No pork for religious reasons     Physical Exam: General: The patient is alert and oriented x3 in no acute distress.  Dermatology:  No ecchymosis, erythema, or edema bilateral.  No open lesions.    Vascular: Palpable pedal pulses bilaterally. Capillary refill within normal limits.  No appreciable edema.    Neurological: Light touch sensation intact bilateral.  MMT 5/5 to lower extremity bilateral. Negative Tinel's sign with percussion of the posterior tibial nerve on the affected extremity.    Musculoskeletal Exam:  There is pain on palpation of the plantarmedial & plantarcentral aspect of right heel.  No gaps or nodules within the plantar fascia.  Positive Windlass mechanism bilateral.  Antalgic gait noted with first few steps upon standing.  No pain on palpation of achilles tendon bilateral.  Ankle df less than 10 degrees with knee extended b/l.  Radiographic Exam (right foot, 3 weightbearing views, 11/06/2023):  Normal osseous mineralization.  No fracture seen.  Mild joint space narrowing of the first MPJ with early squaring of the first metatarsal head.  Medial angulation of the right fifth toe distal phalanx at the DIPJ.  No spur  seen  Assessment/Plan of Care: 1. Plantar fasciitis   2. Pain of right heel     Meds ordered this encounter  Medications   methylPREDNISolone (MEDROL DOSEPAK) 4 MG TBPK tablet    Sig: Take as directed    Dispense:  21 tablet    Refill:  0   PR FT ARCH SUPRT PREMOLD LONGIT FOR HOME USE ONLY DME NIGHT SPLINT  -Reviewed etiology of plantar fasciitis with patient.  Discussed treatment options with patient today, including cortisone injection, NSAID course of treatment, stretching exercises, physical therapy, use of night splint, rest, icing the heel, arch supports/orthotics, and supportive shoe gear.    Patient would prefer to hold off on a cortisone injection at this time.  Will send in a Medrol Dosepak to her pharmacy to help calm down the pain and inflammation for now.  Patient was fitted for a night splint and instructed on how to wear this.  This is a static AFO with a soft interface material to be worn when nonweightbearing/sleeping.  PowerStep arch supports were fitted and dispensed today.  Stretching exercises printed and dispensed to patient  Return in about 4 weeks (around 12/04/2023) for f/u plantar fasciitis R foot.   Clerance Lav, DPM, FACFAS [facility]     [address]  [place], [place] [address]                Office [phone]  Fax [phone]

## 2023-11-06 NOTE — Patient Instructions (Signed)

## 2023-12-05 ENCOUNTER — Ambulatory Visit (INDEPENDENT_AMBULATORY_CARE_PROVIDER_SITE_OTHER): Payer: No Typology Code available for payment source | Admitting: Podiatry

## 2023-12-05 DIAGNOSIS — M722 Plantar fascial fibromatosis: Secondary | ICD-10-CM

## 2023-12-05 DIAGNOSIS — M79671 Pain in right foot: Secondary | ICD-10-CM | POA: Diagnosis not present

## 2023-12-05 DIAGNOSIS — M76821 Posterior tibial tendinitis, right leg: Secondary | ICD-10-CM | POA: Diagnosis not present

## 2023-12-05 NOTE — Progress Notes (Unsigned)
Chief Complaint  Patient presents with   Plantar Fasciitis    Patient is here for 4WK F/U for Pf, states feet are much better, still hurt mildly but nothing compared to prior visit   HPI: 52 y.o. female presenting today with a family member for follow-up of right plantar fasciitis.  Patient feels that she has approximately 80% improvement at this time.  She notes there is no more pain in the left foot.  She notes that the right plantar foot is still having discomfort.  States that she is wearing the power step inserts and they do feel comfortable for her but she does not currently have them in her shoes today.  Past Medical History:  Diagnosis Date   Eczema    Migraine    Plantar fasciitis     Past Surgical History:  Procedure Laterality Date   UVULECTOMY     as a child in [place]    Allergies  Allergen Reactions   Pork-Derived Products Other (See Comments)    No pork for religious reasons     Physical Exam: General: The patient is alert and oriented x3 in no acute distress.  Dermatology:  No ecchymosis, erythema, or edema bilateral.  No open lesions.    Vascular: Palpable pedal pulses bilaterally. Capillary refill within normal limits.  No appreciable edema.    Neurological: Light touch sensation intact bilateral.  MMT 5/5 to lower extremity bilateral. Negative Tinel's sign with percussion of the posterior tibial nerve on the affected extremity.    Musculoskeletal Exam:  There is pain on palpation of the plantarmedial & plantarcentral aspect of right heel.  There is also pain on palpation of the distal posterior tibial tendon on the right as well as the insertion area into the navicular.  No palpable gaps or nodules are noted within the posterior tibial tendon..  No pain on palpation left heel.  No gaps or nodules within the plantar fascia.  Positive Windlass mechanism bilateral.  Antalgic gait noted with first few steps upon standing.  No pain on palpation of achilles  tendon bilateral.  Ankle df less than 10 degrees with knee extended b/l.  Assessment/Plan of Care: 1. Plantar fasciitis   2. Pain of right heel   3. Posterior tibial tendonitis of right leg     AMB REFERRAL TO PHYSICAL THERAPY  -Reviewed etiology of plantar fasciitis with patient.  Discussed treatment options with patient today, including cortisone injection, NSAID course of treatment, stretching exercises, physical therapy, use of night splint, rest, icing the heel, arch supports/orthotics, and supportive shoe gear.    Patient was instructed to continue with the power step inserts.  Will get her set up for physical therapy since she is now having some posterior tibial tendon discomfort as well on the right.  The physical therapy can be performed on both feet for the plantar fasciitis.  Will send in a prescription for meloxicam 15 mg 1 tablet p.o. daily x 30 days  Return in about 4 weeks (around 01/02/2024) for f/u plantar fasciitis after P.T..   Zainab Crumrine D. Samuele Storey, DPM, FACFAS [facility]     [address], [place] [address]  Office [phone]  Fax [phone]

## 2023-12-06 ENCOUNTER — Other Ambulatory Visit: Payer: Self-pay | Admitting: Podiatry

## 2023-12-06 ENCOUNTER — Telehealth: Payer: Self-pay | Admitting: Podiatry

## 2023-12-06 MED ORDER — MELOXICAM 15 MG PO TABS: 15.0000 mg | ORAL_TABLET | Freq: Every day | ORAL | 0 refills | Status: DC

## 2023-12-06 NOTE — Telephone Encounter (Signed)
Pts son came by stating the medications to be prescribed on 12/17 have not been received by the pharmacy on file.

## 2023-12-21 ENCOUNTER — Ambulatory Visit: Payer: No Typology Code available for payment source | Admitting: Allergy
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
## 2023-12-26 ENCOUNTER — Ambulatory Visit: Payer: No Typology Code available for payment source | Attending: Podiatry | Admitting: Physical Therapy

## 2023-12-26 ENCOUNTER — Encounter: Payer: Self-pay | Admitting: Physical Therapy

## 2023-12-26 DIAGNOSIS — M25571 Pain in right ankle and joints of right foot: Secondary | ICD-10-CM | POA: Insufficient documentation

## 2023-12-26 DIAGNOSIS — M545 Low back pain, unspecified: Secondary | ICD-10-CM | POA: Diagnosis present

## 2023-12-26 DIAGNOSIS — M6281 Muscle weakness (generalized): Secondary | ICD-10-CM | POA: Diagnosis present

## 2023-12-26 DIAGNOSIS — M722 Plantar fascial fibromatosis: Secondary | ICD-10-CM | POA: Insufficient documentation

## 2023-12-26 DIAGNOSIS — M79671 Pain in right foot: Secondary | ICD-10-CM | POA: Insufficient documentation

## 2023-12-26 DIAGNOSIS — M76821 Posterior tibial tendinitis, right leg: Secondary | ICD-10-CM | POA: Insufficient documentation

## 2023-12-26 DIAGNOSIS — G8929 Other chronic pain: Secondary | ICD-10-CM | POA: Insufficient documentation

## 2023-12-31 ENCOUNTER — Other Ambulatory Visit: Payer: Self-pay | Admitting: Podiatry

## 2024-01-02 ENCOUNTER — Telehealth: Payer: Self-pay

## 2024-01-02 ENCOUNTER — Encounter: Payer: Self-pay | Admitting: Podiatry

## 2024-01-02 ENCOUNTER — Ambulatory Visit (INDEPENDENT_AMBULATORY_CARE_PROVIDER_SITE_OTHER): Payer: No Typology Code available for payment source | Admitting: Podiatry

## 2024-01-02 DIAGNOSIS — M722 Plantar fascial fibromatosis: Secondary | ICD-10-CM

## 2024-01-02 DIAGNOSIS — M76821 Posterior tibial tendinitis, right leg: Secondary | ICD-10-CM

## 2024-01-02 DIAGNOSIS — M79671 Pain in right foot: Secondary | ICD-10-CM

## 2024-01-02 MED ORDER — MELOXICAM 15 MG PO TABS: 15.0000 mg | ORAL_TABLET | Freq: Every day | ORAL | 0 refills | Status: AC

## 2024-01-02 NOTE — Telephone Encounter (Addendum)
 I tried calling patient to obtain updated insurance information. I was told from PA specialist that patient's insurance was inactive. Need updated information to get prior authorization for MRI. I left a message for patient to either call the office with this information or upload to mychart.

## 2024-01-02 NOTE — Progress Notes (Addendum)
      Chief Complaint  Patient presents with   Foot Pain    Follow up plantar fasciitis/posterior tibial tendonitis right   Its better in the morning, but it really does hurt after I am on it a lot through the day   HPI: 53 y.o. female presents today for f/u of right plantar fasciitis and right posterior tibial tendonitis.  She has been attending physical therapy, but states she still has many bad days, but does have some good days.    Past Medical History:  Diagnosis Date   Eczema    Migraine    Plantar fasciitis     Past Surgical History:  Procedure Laterality Date   UVULECTOMY     as a child in [place]    Allergies  Allergen Reactions   Pork-Derived Products Other (See Comments)    No pork for religious reasons    Physical Exam: Palpable pedal pulses right foot.  No edema noted.  Pain along plantarmedial and plantarcentral plantar fascia right foot.  Pain on palpation of posterior tibial tendon at insertion, right foot.  Pain with resisted supination of right foot.    Assessment/Plan of Care: 1. Posterior tibial tendonitis of right leg   2. Plantar fasciitis   3. Pain of right heel      Meds ordered this encounter  Medications   meloxicam  (MOBIC ) 15 MG tablet    Sig: Take 1 tablet (15 mg total) by mouth daily.    Dispense:  30 tablet    Refill:  0   PT ORTHOSIS TO LOWER EXTREMITY  Discussed clinical findings with patient today.  Will renew Rx meloxicam  for pain and inflammation.  Will order MRI to evaluate for cause of continued pain to heel and P.T. tendon. If there is no clear improvement, we will need to plan for surgical intervention.  Also need to evaluate for tear of plantar fasciitis, and confirm tendinitis vs tear of P.T. tendon now.  Her P.T. tendon may be getting strained due to altered gait from chronic plantar fasciitis.  The right foot has not improved from physical therapy like the left foot did.   Patient was fitted for a SpeedBrace AFO right foot.   She needs additional, adjustable, support for the arch right foot.  She'll wear this at all times WB.  She noted immediate improvement of pain upon standing in this brace.    F/u after MRI.  If there is no clear improvement, we will need to plan for surgical intervention.   Awanda CHARM Imperial, DPM, FACFAS [facility]     [address] [place], [place] [address]                Office [phone]  Fax [phone]

## 2024-01-03 NOTE — Telephone Encounter (Signed)
 Patient's son called back they checked with insurance and this is the location they would ike order for MRI sent to.   [facility]  [address]  [address]  [place], [place] [address]

## 2024-01-03 NOTE — Telephone Encounter (Signed)
 I was sent a secure chat message from Rebecca Blevins stating that [facility] said they are out of network with insurance and suggest sending order to an in network facility.  I contacted patient to let her know. I informed patient to contact her insurance company to see what facility is in network and we could see about sending order there. She verbalized her understanding and stated that she wil check with insurance company.

## 2024-01-04 ENCOUNTER — Ambulatory Visit: Payer: No Typology Code available for payment source | Admitting: Physical Therapy

## 2024-01-04 ENCOUNTER — Encounter: Payer: Self-pay | Admitting: Physical Therapy

## 2024-01-04 DIAGNOSIS — M25571 Pain in right ankle and joints of right foot: Secondary | ICD-10-CM | POA: Diagnosis not present

## 2024-01-04 DIAGNOSIS — M6281 Muscle weakness (generalized): Secondary | ICD-10-CM
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
## 2024-01-07 ENCOUNTER — Other Ambulatory Visit: Payer: No Typology Code available for payment source

## 2024-01-09 ENCOUNTER — Encounter: Payer: Self-pay | Admitting: Physical Therapy

## 2024-01-09 ENCOUNTER — Ambulatory Visit: Payer: No Typology Code available for payment source | Admitting: Physical Therapy

## 2024-01-09 DIAGNOSIS — M25571 Pain in right ankle and joints of right foot: Secondary | ICD-10-CM

## 2024-01-09 DIAGNOSIS — M6281 Muscle weakness (generalized): Secondary | ICD-10-CM
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
## 2024-01-16 ENCOUNTER — Ambulatory Visit: Payer: No Typology Code available for payment source | Admitting: Physical Therapy

## 2024-01-16 ENCOUNTER — Telehealth: Payer: Self-pay

## 2024-01-16 ENCOUNTER — Encounter: Payer: Self-pay | Admitting: Physical Therapy

## 2024-01-16 DIAGNOSIS — M79671 Pain in right foot: Secondary | ICD-10-CM

## 2024-01-16 DIAGNOSIS — M76821 Posterior tibial tendinitis, right leg: Secondary | ICD-10-CM

## 2024-01-16 DIAGNOSIS — M6281 Muscle weakness (generalized): Secondary | ICD-10-CM

## 2024-01-16 DIAGNOSIS — M545 Low back pain, unspecified: Secondary | ICD-10-CM

## 2024-01-16 DIAGNOSIS — M25571 Pain in right ankle and joints of right foot: Secondary | ICD-10-CM | POA: Diagnosis not present

## 2024-01-16 DIAGNOSIS — M722 Plantar fascial fibromatosis: Secondary | ICD-10-CM

## 2024-01-16 NOTE — Telephone Encounter (Signed)
MRI was denied due to no notation of 4 weeks of exercises done within the last six months. You can discuss this with a physician reviewer by calling [phone]
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
## 2024-01-17 NOTE — Telephone Encounter (Signed)
I resent the information along with the additional notes from PT that were attached.

## 2024-01-17 NOTE — Telephone Encounter (Signed)
Additional information that was sent on 01/16/24 was sent after PA determination and closure. A P2P can be done by calling [phone] Ref [phone]

## 2024-01-17 NOTE — Addendum Note (Signed)
Addended byLucia Estelle D on: 01/17/2024 12:45 PM   Modules accepted: Orders

## 2024-01-18 ENCOUNTER — Telehealth: Payer: Self-pay

## 2024-01-18 NOTE — Telephone Encounter (Addendum)
Spoke with Rebecca Blevins with [employer] and I was told that a new case could not be started because it was too soon. I was told that an appeal had to be done and can only be done directly through the insurance company. Patient has to give [place] written consent to start an appeal on their behalf.

## 2024-01-23 ENCOUNTER — Ambulatory Visit: Payer: No Typology Code available for payment source | Attending: Podiatry | Admitting: Physical Therapy

## 2024-01-23 ENCOUNTER — Encounter: Payer: Self-pay | Admitting: Physical Therapy

## 2024-01-23 DIAGNOSIS — G8929 Other chronic pain: Secondary | ICD-10-CM

## 2024-01-23 DIAGNOSIS — M6281 Muscle weakness (generalized): Secondary | ICD-10-CM

## 2024-01-23 DIAGNOSIS — M25571 Pain in right ankle and joints of right foot: Secondary | ICD-10-CM

## 2024-01-23 DIAGNOSIS — M545 Low back pain, unspecified: Secondary | ICD-10-CM | POA: Insufficient documentation

## 2024-01-23 NOTE — Telephone Encounter (Signed)
Yes this is for the new order that you placed.
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
## 2024-01-26 ENCOUNTER — Other Ambulatory Visit: Payer: Self-pay | Admitting: Podiatry

## 2024-05-06 ENCOUNTER — Other Ambulatory Visit: Payer: Self-pay

## 2024-05-06 ENCOUNTER — Emergency Department (HOSPITAL_BASED_OUTPATIENT_CLINIC_OR_DEPARTMENT_OTHER)
Admission: EM | Admit: 2024-05-06 | Discharge: 2024-05-07 | Disposition: A | Discharge status: Home / Self Care | Attending: Emergency Medicine | Admitting: Emergency Medicine

## 2024-05-06 ENCOUNTER — Emergency Department (HOSPITAL_BASED_OUTPATIENT_CLINIC_OR_DEPARTMENT_OTHER): Admitting: Radiology

## 2024-05-06 DIAGNOSIS — R0789 Other chest pain: Secondary | ICD-10-CM | POA: Insufficient documentation

## 2024-05-06 DIAGNOSIS — M546 Pain in thoracic spine: Secondary | ICD-10-CM | POA: Diagnosis not present

## 2024-05-06 LAB — BASIC METABOLIC PANEL WITH GFR
Anion gap: 11 (ref 5–15)
BUN: 9 mg/dL (ref 6–20)
CO2: 26 mmol/L (ref 22–32)
Calcium: 10 mg/dL (ref 8.9–10.3)
Chloride: 103 mmol/L (ref 98–111)
Creatinine, Ser: 0.71 mg/dL (ref 0.44–1.00)
GFR, Estimated: >60 mL/min (ref >60)
Glucose, Bld: 102 mg/dL — ABNORMAL HIGH (ref 70–99)
Potassium: 3.9 mmol/L (ref 3.5–5.1)
Sodium: 140 mmol/L (ref 135–145)

## 2024-05-06 LAB — CBC
HCT: 37.1 % (ref 36.0–46.0)
Hemoglobin: 12.6 g/dL (ref 12.0–15.0)
MCH: 29.6 pg (ref 26.0–34.0)
MCHC: 34 g/dL (ref 30.0–36.0)
MCV: 87.3 fL (ref 80.0–100.0)
Platelets: 362 10*3/uL (ref 150–400)
RBC: 4.25 MIL/uL (ref 3.87–5.11)
RDW: 12.6 % (ref 11.5–15.5)
WBC: 5.2 10*3/uL (ref 4.0–10.5)
nRBC: 0 % (ref 0.0–0.2)

## 2024-05-06 LAB — TROPONIN T, HIGH SENSITIVITY: Troponin T High Sensitivity: <15 ng/L (ref <19)

## 2024-05-06 NOTE — ED Triage Notes (Signed)
 Right arm and chest pain- started 1 hr PTA.

## 2024-05-07 ENCOUNTER — Other Ambulatory Visit: Payer: Self-pay

## 2024-05-07 LAB — TROPONIN T, HIGH SENSITIVITY: Troponin T High Sensitivity: <15 ng/L (ref <19)

## 2024-05-07 MED ORDER — KETOROLAC TROMETHAMINE 30 MG/ML IJ SOLN
30.0000 mg | Freq: Once | INTRAMUSCULAR | Status: AC
  Administered 2024-05-07: 30 mg via INTRAVENOUS
  Filled 2024-05-07: qty 1

## 2024-05-07 NOTE — Discharge Instructions (Signed)
 Take ibuprofen  600 mg every 6 hours as needed for pain.  Rest.  Follow-up with primary doctor if not improving in the next week, and return to the ER if you develop worsening pain, difficulty breathing, high fevers, or for other new and concerning symptoms.

## 2024-05-07 NOTE — ED Provider Notes (Signed)
 [place] EMERGENCY DEPARTMENT AT [facility] Provider Note   CSN: [id number] Arrival date & time: 05/06/24  2031     History  Chief Complaint  Patient presents with   Chest Pain    ASHLYNNE SHETTERLY is a 53 y.o. female.  Patient is a 53 year old female presenting with complaints of pain in her right upper chest and back.  This started approximately 1 hour prior to presentation.  She reports just getting out of the shower and walking into the bedroom when the pain began.  She describes a sharp pain that radiates through to her back.  This is worse when she moves.  There is no shortness of breath, nausea, diaphoresis.  She denies any recent exertional symptoms.  Patient has no prior cardiac history and has no cardiac risk factors.       Home Medications Prior to Admission medications   Medication Sig Start Date End Date Taking? Authorizing Provider  betamethasone  valerate ointment (VALISONE ) 0.1 % Apply 1 Application topically 2 (two) times daily as needed. 09/20/23   Brian Campanile, MD  cetirizine  (ZYRTEC ) 10 MG tablet Take 1 tablet (10 mg total) by mouth daily. 07/12/23   Lavada Porteous, DO  EPINEPHrine  0.3 mg/0.3 mL IJ SOAJ injection Inject 0.3 mg into the muscle as needed for anaphylaxis. 09/26/23   Brian Campanile, MD  ibuprofen  (ADVIL ) 800 MG tablet Take 1 tablet (800 mg total) by mouth every 8 (eight) hours as needed (pain). 05/29/23   Ann Keto, MD  meloxicam  (MOBIC ) 15 MG tablet Take 1 tablet (15 mg total) by mouth daily. 01/02/24   McCaughan, Dia D, DPM  pimecrolimus  (ELIDEL ) 1 % cream Apply topically 2 (two) times daily as needed. 09/20/23   Brian Campanile, MD  SUMAtriptan  (IMITREX ) 25 MG tablet Take 1 tablet (25 mg total) by mouth every 2 (two) hours as needed for migraine. May repeat in 2 hours if headache persists or recurs not to exceed 200mg  in 24 hours 08/09/23   Santa Cuba, MD  amitriptyline  (ELAVIL ) 10 MG tablet Take 1  tablet (10 mg total) by mouth at bedtime. 02/20/20 05/20/20  Lacinda Pica, MD  famotidine  (PEPCID ) 10 MG tablet Take 1 tablet (10 mg total) by mouth 2 (two) times daily. 01/27/20 05/20/20  Lacinda Pica, MD      Allergies    Pork-derived products    Review of Systems   Review of Systems  All other systems reviewed and are negative.   Physical Exam Updated Vital Signs BP 127/78   Pulse 64   Temp 98.1 F (36.7 C) (Oral)   Resp 14   LMP 04/17/2013   SpO2 99%  Physical Exam Vitals and nursing note reviewed.  Constitutional:      General: She is not in acute distress.    Appearance: She is well-developed. She is not diaphoretic.  HENT:     Head: Normocephalic and atraumatic.  Cardiovascular:     Rate and Rhythm: Normal rate and regular rhythm.     Heart sounds: No murmur heard.    No friction rub. No gallop.  Pulmonary:     Effort: Pulmonary effort is normal. No respiratory distress.     Breath sounds: Normal breath sounds. No wheezing.     Comments: There is tenderness to palpation noted in the right posterior shoulder/scapular region.  There is also tenderness noted to the right upper chest. Abdominal:     General: Bowel sounds are normal. There is no distension.  Palpations: Abdomen is soft.     Tenderness: There is no abdominal tenderness.  Musculoskeletal:        General: Normal range of motion.     Cervical back: Normal range of motion and neck supple.     Right lower leg: No tenderness. No edema.     Left lower leg: No tenderness. No edema.     Comments: Is no calf pain.  Celine Collard' sign is absent bilaterally.  Skin:    General: Skin is warm and dry.  Neurological:     General: No focal deficit present.     Mental Status: She is alert and oriented to person, place, and time.     ED Results / Procedures / Treatments   Labs (all labs ordered are listed, but only abnormal results are displayed) Labs Reviewed  BASIC METABOLIC PANEL WITH GFR - Abnormal; Notable  for the following components:      Result Value   Glucose, Bld 102 (*)    All other components within normal limits  CBC  TROPONIN T, HIGH SENSITIVITY  TROPONIN T, HIGH SENSITIVITY    EKG ED ECG REPORT   Date: 05/07/2024  Rate: 63  Rhythm: normal sinus rhythm  QRS Axis: normal  Intervals: normal  ST/T Wave abnormalities: normal  Conduction Disutrbances:none  Narrative Interpretation:   Old EKG Reviewed: none available  I have personally reviewed the EKG tracing and agree with the computerized printout as noted.   Radiology DG Chest 2 View Result Date: 05/06/2024 CLINICAL DATA:  Chest pain SOB EXAM: CHEST - 2 VIEW COMPARISON:  Chest x-ray 01/31/2019 FINDINGS: The heart and mediastinal contours are within normal limits. Left base atelectasis. No focal consolidation. No pulmonary edema. No pleural effusion. No pneumothorax. No acute osseous abnormality. IMPRESSION: No active cardiopulmonary disease. Electronically Signed   By: Morgane  Naveau M.D.   On: 05/06/2024 21:46    Procedures Procedures    Medications Ordered in ED Medications  ketorolac  (TORADOL ) 30 MG/ML injection 30 mg (has no administration in time range)    ED Course/ Medical Decision Making/ A&P  Patient is a 53 year old female presenting with right chest and upper back pain as described in the HPI.  Patient arrives with stable vital signs and is afebrile.  Oxygen saturations are 100% with no tachycardia.  Laboratory studies obtained including CBC, metabolic panel, and troponin x 2, all of which are unremarkable.  Chest x-ray shows no acute process.  Cause of patient's pain most likely musculoskeletal.  It is reproducible with palpation and movement.  Her cardiac workup is unremarkable and she has no cardiac risk factors.  I feel as though she can safely be discharged with NSAIDs rest, and return/follow-up as needed.  Final Clinical Impression(s) / ED Diagnoses Final diagnoses:  None    Rx / DC  Orders ED Discharge Orders     None         Orvilla Blander, MD 05/07/24 [phone]

## 2024-05-23 ENCOUNTER — Encounter (INDEPENDENT_AMBULATORY_CARE_PROVIDER_SITE_OTHER): Admitting: Family Medicine

## 2024-05-23 NOTE — Progress Notes (Deleted)
    SUBJECTIVE:   CHIEF COMPLAINT / HPI:   Pre-travel check-up ***  Presented to the emergency department recently on 5/19 for chest pain.  Workup, including troponins and CXR, was reassuringly negative and she was discharged home in stable condition. ***  PERTINENT  PMH / PSH: Reviewed. GERD Headaches, migraines? ***  OBJECTIVE:   LMP 04/17/2013   *** General: ***-appearing, no acute distress. HEENT: normocephalic, PERRLA, MMM, bilateral TM visualized without erythema or bulging. Cardio: Regular rate, *** rhythm, no murmurs on exam. Pulm: Clear, no wheezing, no crackles. No increased work of breathing. Abdominal: bowel sounds present, soft, non-tender, non-distended. Extremities: no peripheral edema. Moves all extremities equally. Neuro: Alert and oriented x3, speech normal in content, no facial asymmetry, strength intact and equal bilaterally in UE and LE, pupils equal and reactive to light.  Psych:  Cognition and judgment appear intact. Alert, communicative, and cooperative with normal attention span and concentration. No apparent delusions, illusions, hallucinations    ASSESSMENT/PLAN:   Assessment & Plan      Omar Bibber, DO [facility]

## 2024-05-27 ENCOUNTER — Encounter: Payer: Self-pay | Admitting: Family Medicine

## 2024-05-27 ENCOUNTER — Ambulatory Visit: Admitting: Family Medicine

## 2024-05-27 VITALS — BP 113/78 | HR 76 | Ht 66.0 in | Wt 179.8 lb

## 2024-05-27 DIAGNOSIS — Z23 Encounter for immunization: Secondary | ICD-10-CM | POA: Diagnosis not present

## 2024-05-27 DIAGNOSIS — Z7184 Encounter for health counseling related to travel: Secondary | ICD-10-CM

## 2024-05-27 MED ORDER — DOXYCYCLINE HYCLATE 100 MG PO TABS: 100.0000 mg | ORAL_TABLET | Freq: Every day | ORAL | 0 refills | Status: AC

## 2024-05-27 NOTE — Progress Notes (Signed)
    SUBJECTIVE:   CHIEF COMPLAINT / HPI:   Travel planning Visiting son in [place]. Needs Yellow Fever vaccination, and likely malaria prophylaxis. She tried to get vaccine at health dept but was told they no longer have that vaccine.  She tells me she has had the yellow fever vaccine some years prior but cannot remember exactly when, and no longer has the documentation available. Travel advisors to [place] advised her that she would be denied entry without proof of vaccination. She is traveling in about 2 weeks and will be gone for nearly 5 weeks.  PERTINENT  PMH / PSH: Reviewed.  OBJECTIVE:   BP 113/78   Pulse 76   Ht 5\' 6"  (1.676 m)   Wt 179 lb 12.8 oz (81.6 kg)   LMP 04/17/2013   SpO2 98%   BMI 29.02 kg/m   General: well-appearing, pleasant, no acute distress. HEENT: normocephalic, EOM grossly intact. Cardio: Regular rate, regular rhythm, no murmurs on exam. Pulm: No increased work of breathing. Abdominal: non-distended. Extremities: no peripheral edema. Moves all extremities equally. Neuro: Alert and oriented x3. Psych:  Cognition and judgment appear intact.   ASSESSMENT/PLAN:   Assessment & Plan Travel advice encounter Confirmed on CDC website for travel to [place] pt will need Yellow Fever vaccine and malaria prophylaxis. - instructions given for [employer] in [place] and [place] so she may call and decide where to obtain her vaccine. - doxycycline 100 mg sent to pharmacy - pt instructed to start taking daily 2 days prior to departure, every day during trip, and daily for another 4 weeks once she returns to the [place] .    Omar Bibber, DO [facility]

## 2024-05-27 NOTE — Patient Instructions (Addendum)
 Dear Nadyne Austin  Today we discussed the following concerns and plans:  Travel preparation: To travel to [place] you will need: 1) Yellow Fever Vaccine 2) Malaria prophylaxis   Malaria Chemoprophylaxis Guidelines Doxycycline - Adults 100mg  daily, start 1-2 days before leaving and continue for 4 weeks after return   Two options for getting the Yellow Fever vaccine:  [facility] [address] [place], [place] [address] [phone]  [facility] Address: [address], [place], [place] [address] Phone: [phone]   Omar Bibber, DO [facility], PGY-1

## 2024-05-30 NOTE — Progress Notes (Signed)
 ERRONEOUS ENCOUNTER

## 2024-08-25 ENCOUNTER — Encounter (HOSPITAL_COMMUNITY): Payer: Self-pay | Admitting: Emergency Medicine

## 2024-08-25 ENCOUNTER — Emergency Department (HOSPITAL_COMMUNITY)
Admission: EM | Admit: 2024-08-25 | Discharge: 2024-08-25 | Disposition: A | Discharge status: Home / Self Care | Payer: Self-pay | Attending: Emergency Medicine | Admitting: Emergency Medicine

## 2024-08-25 ENCOUNTER — Emergency Department (HOSPITAL_COMMUNITY): Payer: Self-pay

## 2024-08-25 DIAGNOSIS — Y93G3 Activity, cooking and baking: Secondary | ICD-10-CM | POA: Insufficient documentation

## 2024-08-25 DIAGNOSIS — S61012A Laceration without foreign body of left thumb without damage to nail, initial encounter: Secondary | ICD-10-CM | POA: Insufficient documentation

## 2024-08-25 DIAGNOSIS — W260XXA Contact with knife, initial encounter: Secondary | ICD-10-CM | POA: Insufficient documentation

## 2024-08-25 DIAGNOSIS — Z23 Encounter for immunization: Secondary | ICD-10-CM | POA: Insufficient documentation

## 2024-08-25 MED ORDER — TETANUS-DIPHTH-ACELL PERTUSSIS 5-2.5-18.5 LF-MCG/0.5 IM SUSY
0.5000 mL | PREFILLED_SYRINGE | Freq: Once | INTRAMUSCULAR | Status: AC
  Administered 2024-08-25: 0.5 mL via INTRAMUSCULAR
  Filled 2024-08-25: qty 0.5

## 2024-08-25 MED ORDER — IBUPROFEN 400 MG PO TABS
600.0000 mg | ORAL_TABLET | Freq: Once | ORAL | Status: AC
  Administered 2024-08-25: 600 mg via ORAL
  Filled 2024-08-25: qty 1

## 2024-08-25 MED ORDER — LIDOCAINE HCL (PF) 1 % IJ SOLN
10.0000 mL | Freq: Once | INTRAMUSCULAR | Status: AC
  Administered 2024-08-25: 10 mL
  Filled 2024-08-25: qty 10

## 2024-08-25 NOTE — Discharge Instructions (Signed)
 You were seen in the emergency department for a laceration to your left thumb.  While you are here we did a physical exam, x-rays, washed out your wound, sutured it and updated your tetanus vaccine.  You have 3 stitches that need to be removed in 10 to 14 days..  Please follow-up with your primary care doctor for reevaluation of the wound.  Come back to the emergency department you develop fevers, significant redness around the wound, thick white discharge from the wound, develop discoloration of the thumb or if you have any other reason to believe need emergency care.  You may take ibuprofen  or Tylenol  for pain.  Performed milligrams of ibuprofen  every 6 hours or 500 to 1000 mg Tylenol  every 8 hours

## 2024-08-25 NOTE — ED Provider Notes (Signed)
 [place] EMERGENCY DEPARTMENT AT [facility] Provider Note HPI Rebecca Blevins is a 53 y.o. female with a medical history as below who presents to the emergency department for laceration to the left thumb with a kitchen knife while cooking.  She noted immediate pain and bleeding which subsided with pressure.  Denies other injuries.  She is not on any antiplatelet or anticoagulants.  She has no sensory changes over her thumb and is still able to move it without difficulty  Past Medical History:  Diagnosis Date   Eczema    Migraine    Plantar fasciitis    Past Surgical History:  Procedure Laterality Date   UVULECTOMY     as a child in [place]    Review of Systems Pertinent positives and negative findings are listed as part of the History of Present Illness and MDM  Physical Exam Vitals:   08/25/24 1838  BP: (!) 145/89  Pulse: 79  Resp: 16  Temp: 98.1 F (36.7 C)  SpO2: 95%     Constitutional Nursing notes reviewed Vital signs reviewed  HEENT No obvious trauma Pupils cross midline  Respiratory Effort normal Breathing well on room air  CV Normal rate and rhythm   MSK 3 cm laceration to the medial aspect of the left thumb, hemostatic.  Full range of motion in the MCP and IP joint.  Sensation light touch intact   Neuro Awake and alert Pupils cross midline Moving all extremities    MDM:  Initial Differential Diagnoses includes fracture, dislocation, foreign body, soft tissue injury  I reviewed the patient's vitals, the nursing triage note and evaluated the patient at bedside.   Patient presents with a 3 cm laceration to the left thumb.  Neurovascular intact as detailed above.  Patient is able to bend and extend at the MCP/IP joint.  X-ray showed no evidence of fracture, dislocation or foreign body.  Digital block performed.  Wound was thoroughly washed out with soap, water and Betadine.  It was explored with full range of motion and repaired with 3 nonabsorbable  sutures.  Tdap was updated.    Procedures: .Laceration Repair  Date/Time: 08/25/2024 11:54 PM  Performed by: Dionisio Blunt, MD Authorized by: Yolande Lamar BROCKS, MD   Consent:    Consent obtained:  Verbal   Consent given by:  Patient   Risks, benefits, and alternatives were discussed: yes     Risks discussed:  Infection, need for additional repair, pain, vascular damage, poor cosmetic result, nerve damage and poor wound healing   Alternatives discussed:  No treatment and delayed treatment Universal protocol:    Procedure explained and questions answered to patient or proxy's satisfaction: yes     Relevant documents present and verified: yes     Test results available: yes     Imaging studies available: yes     Required blood products, implants, devices, and special equipment available: yes     Site/side marked: yes     Patient identity confirmed:  Hospital-assigned identification number Anesthesia:    Anesthesia method:  Nerve block   Block needle gauge:  27 G   Block anesthetic:  Lidocaine  1% w/o epi   Block injection procedure:  Anatomic landmarks identified, anatomic landmarks palpated, introduced needle, negative aspiration for blood and incremental injection   Block outcome:  Incomplete block Laceration details:    Location:  Finger   Finger location:  L thumb   Length (cm):  3   Depth (mm):  3 Pre-procedure  details:    Preparation:  Patient was prepped and draped in usual sterile fashion and imaging obtained to evaluate for foreign bodies Exploration:    Limited defect created (wound extended): no     Hemostasis achieved with:  Tourniquet and direct pressure   Imaging obtained: x-ray     Imaging outcome: foreign body not noted     Wound exploration: wound explored through full range of motion and entire depth of wound visualized     Contaminated: no   Treatment:    Area cleansed with:  Povidone-iodine, chlorhexidine and soap and water   Amount of cleaning:   Extensive   Irrigation solution:  Tap water   Irrigation method:  Syringe and tap   Debridement:  None   Undermining:  None   Scar revision: no   Skin repair:    Repair method:  Sutures   Suture size:  5-0   Suture material:  Prolene   Suture technique:  Simple interrupted   Number of sutures:  3 Approximation:    Approximation:  Close Repair type:    Repair type:  Simple Post-procedure details:    Dressing:  Non-adherent dressing and sterile dressing   Procedure completion:  Tolerated   Medications administered in the ED: Medications  lidocaine  (PF) (XYLOCAINE ) 1 % injection 10 mL (10 mLs Other Given 08/25/24 2132)  Tdap (BOOSTRIX ) injection 0.5 mL (0.5 mLs Intramuscular Given 08/25/24 2132)  ibuprofen  (ADVIL ) tablet 600 mg (600 mg Oral Given 08/25/24 2322)     Impression: 1. Thumb laceration, left, initial encounter      Patient's presentation is most consistent with acute presentation with potential threat to life or bodily function.  Disposition: ED Disposition:  Discharge   Discharge: Patient is felt to be medically appropriate for discharge at this time. Patient was instructed to follow up with their primary care doctor/specialists listed above for re-evaluation. Patient was given strict return precautions.  ED Discharge Orders     None             Dionisio Blunt, MD 08/25/24 2357    Yolande Lamar BROCKS, MD 08/29/24 1719

## 2024-08-25 NOTE — ED Triage Notes (Signed)
 Pt here from home with c/o a lac to the left thumb with a knife pt able to move thumb ,

## 2024-09-04 ENCOUNTER — Ambulatory Visit (INDEPENDENT_AMBULATORY_CARE_PROVIDER_SITE_OTHER): Payer: Self-pay | Admitting: Family Medicine

## 2024-09-04 ENCOUNTER — Encounter: Payer: Self-pay | Admitting: Family Medicine

## 2024-09-04 VITALS — BP 103/87 | HR 84 | Ht 66.0 in | Wt 184.6 lb

## 2024-09-04 DIAGNOSIS — Z4802 Encounter for removal of sutures: Secondary | ICD-10-CM

## 2024-09-04 NOTE — Patient Instructions (Signed)
 It was wonderful to see you today.  Please bring ALL of your medications with you to every visit.   Today we talked about:  Suture removal - Your cut looks very well healed now. You can apply some vaseline to help with the rest of the healing process. Let us  know if you have any other issues or concerns.   Thank you for choosing [facility].   Please call [phone] with any questions about today's appointment.   Areta Saliva, MD  Family Medicine
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
## 2024-11-14 ENCOUNTER — Emergency Department (HOSPITAL_COMMUNITY)
Admission: EM | Admit: 2024-11-14 | Discharge: 2024-11-14 | Disposition: A | Discharge status: Home / Self Care | Payer: Self-pay | Attending: Emergency Medicine | Admitting: Emergency Medicine

## 2024-11-14 ENCOUNTER — Encounter (HOSPITAL_COMMUNITY): Payer: Self-pay

## 2024-11-14 ENCOUNTER — Emergency Department (HOSPITAL_COMMUNITY): Payer: Self-pay

## 2024-11-14 ENCOUNTER — Other Ambulatory Visit: Payer: Self-pay

## 2024-11-14 DIAGNOSIS — Y9301 Activity, walking, marching and hiking: Secondary | ICD-10-CM | POA: Diagnosis not present

## 2024-11-14 DIAGNOSIS — M25512 Pain in left shoulder: Secondary | ICD-10-CM | POA: Diagnosis not present

## 2024-11-14 DIAGNOSIS — M25511 Pain in right shoulder: Secondary | ICD-10-CM | POA: Insufficient documentation

## 2024-11-14 DIAGNOSIS — M545 Low back pain, unspecified: Secondary | ICD-10-CM | POA: Insufficient documentation

## 2024-11-14 DIAGNOSIS — Y92481 Parking lot as the place of occurrence of the external cause: Secondary | ICD-10-CM | POA: Insufficient documentation

## 2024-11-14 MED ORDER — CYCLOBENZAPRINE HCL 10 MG PO TABS: 10.0000 mg | ORAL_TABLET | Freq: Two times a day (BID) | ORAL | 0 refills | Status: AC | PRN

## 2024-11-14 MED ORDER — LIDOCAINE 5 % EX PTCH
1.0000 | MEDICATED_PATCH | Freq: Once | CUTANEOUS | Status: DC
  Administered 2024-11-14: 1 via TRANSDERMAL
  Filled 2024-11-14: qty 1

## 2024-11-14 MED ORDER — HYDROCODONE-ACETAMINOPHEN 5-325 MG PO TABS
1.0000 | ORAL_TABLET | Freq: Once | ORAL | Status: AC
  Administered 2024-11-14: 1 via ORAL
  Filled 2024-11-14: qty 1

## 2024-11-14 MED ORDER — LIDOCAINE 5 % EX PTCH: 1.0000 | MEDICATED_PATCH | Freq: Every day | CUTANEOUS | 0 refills | Status: AC | PRN

## 2024-11-14 MED ORDER — HYDROCODONE-ACETAMINOPHEN 5-325 MG PO TABS: 1.0000 | ORAL_TABLET | ORAL | 0 refills | Status: AC | PRN

## 2024-11-14 NOTE — Discharge Instructions (Signed)
 It was a pleasure caring for you today in the emergency department.  Please follow-up with your PCP in the office in the next week.  Please return to the emergency department for any worsening or worrisome symptoms.
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
## 2024-11-18 ENCOUNTER — Ambulatory Visit: Admitting: Family Medicine

## 2024-11-18 ENCOUNTER — Encounter: Payer: Self-pay | Admitting: Family Medicine

## 2024-11-18 VITALS — BP 115/81 | HR 74 | Ht 66.0 in | Wt 180.2 lb

## 2024-11-18 DIAGNOSIS — Z13220 Encounter for screening for lipoid disorders: Secondary | ICD-10-CM

## 2024-11-18 DIAGNOSIS — M25511 Pain in right shoulder: Secondary | ICD-10-CM

## 2024-11-18 DIAGNOSIS — Z0000 Encounter for general adult medical examination without abnormal findings: Secondary | ICD-10-CM

## 2024-11-18 DIAGNOSIS — Z1211 Encounter for screening for malignant neoplasm of colon: Secondary | ICD-10-CM

## 2024-11-18 DIAGNOSIS — R7309 Other abnormal glucose: Secondary | ICD-10-CM | POA: Diagnosis not present

## 2024-11-18 DIAGNOSIS — M545 Low back pain, unspecified: Secondary | ICD-10-CM | POA: Diagnosis not present
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
## 2024-11-19 LAB — LIPID PANEL
Chol/HDL Ratio: 3.2 ratio (ref 0.0–4.4)
Cholesterol, Total: 158 mg/dL (ref 100–199)
HDL: 50 mg/dL (ref >39)
LDL Chol Calc (NIH): 79 mg/dL (ref 0–99)
Triglycerides: 172 mg/dL — ABNORMAL HIGH (ref 0–149)
VLDL Cholesterol Cal: 29 mg/dL (ref 5–40)
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
## 2024-11-20 ENCOUNTER — Ambulatory Visit: Admitting: Rehabilitative and Restorative Service Providers"

## 2024-11-21 ENCOUNTER — Ambulatory Visit: Payer: Self-pay | Admitting: Family Medicine

## 2024-11-21 NOTE — Progress Notes (Signed)
 The 10-year ASCVD risk score (Arnett DK, et al., 2019) is: 1.6%   Values used to calculate the score:     Age: 53 years     Clincally relevant sex: Female     Is Non-Hispanic African American: Yes     Diabetic: No     Tobacco smoker: No     Systolic Blood Pressure: 115 mmHg     Is BP treated: No     HDL Cholesterol: 50 mg/dL     Total Cholesterol: 158 mg/dL Will counsel patient on lifestyle modifications.

## 2024-12-24 ENCOUNTER — Other Ambulatory Visit: Payer: Self-pay

## 2024-12-24 ENCOUNTER — Ambulatory Visit: Attending: Family Medicine

## 2024-12-24 DIAGNOSIS — M545 Low back pain, unspecified: Secondary | ICD-10-CM | POA: Insufficient documentation

## 2024-12-24 DIAGNOSIS — M6281 Muscle weakness (generalized): Secondary | ICD-10-CM | POA: Insufficient documentation

## 2024-12-24 DIAGNOSIS — M5459 Other low back pain: Secondary | ICD-10-CM | POA: Diagnosis present

## 2024-12-24 DIAGNOSIS — M25511 Pain in right shoulder: Secondary | ICD-10-CM | POA: Insufficient documentation
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
## 2024-12-31 ENCOUNTER — Ambulatory Visit

## 2024-12-31 DIAGNOSIS — M6281 Muscle weakness (generalized): Secondary | ICD-10-CM

## 2024-12-31 DIAGNOSIS — M25511 Pain in right shoulder: Secondary | ICD-10-CM | POA: Diagnosis not present

## 2024-12-31 DIAGNOSIS — M5459 Other low back pain: Secondary | ICD-10-CM
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
## 2025-01-06 ENCOUNTER — Ambulatory Visit

## 2025-01-06 DIAGNOSIS — M25511 Pain in right shoulder: Secondary | ICD-10-CM | POA: Diagnosis not present

## 2025-01-06 DIAGNOSIS — M5459 Other low back pain: Secondary | ICD-10-CM

## 2025-01-06 DIAGNOSIS — M6281 Muscle weakness (generalized): Secondary | ICD-10-CM
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
## 2025-01-07 ENCOUNTER — Ambulatory Visit

## 2025-01-14 ENCOUNTER — Ambulatory Visit
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
## 2025-01-15 ENCOUNTER — Ambulatory Visit

## 2025-01-20 ENCOUNTER — Encounter: Payer: Self-pay | Admitting: Gastroenterology

## 2025-01-21 ENCOUNTER — Ambulatory Visit

## 2025-01-21 DIAGNOSIS — M5459 Other low back pain: Secondary | ICD-10-CM

## 2025-01-21 DIAGNOSIS — M6281 Muscle weakness (generalized): Secondary | ICD-10-CM

## 2025-01-21 DIAGNOSIS — M25511 Pain in right shoulder: Secondary | ICD-10-CM

## 2025-01-29 ENCOUNTER — Ambulatory Visit

## 2025-02-05 ENCOUNTER — Ambulatory Visit
